# Patient Record
Sex: Female | Born: 1955 | Race: White | Hispanic: No | Marital: Married | Smoking: Former smoker
Health system: Southern US, Community
[De-identification: ages and names within clinical notes are randomized; demographics above are authoritative.]

## PROBLEM LIST (undated history)

## (undated) DIAGNOSIS — F32A Depression, unspecified: Secondary | ICD-10-CM

## (undated) DIAGNOSIS — I10 Essential (primary) hypertension: Secondary | ICD-10-CM

## (undated) DIAGNOSIS — E785 Hyperlipidemia, unspecified: Secondary | ICD-10-CM

## (undated) DIAGNOSIS — F329 Major depressive disorder, single episode, unspecified: Secondary | ICD-10-CM

## (undated) DIAGNOSIS — S12100A Unspecified displaced fracture of second cervical vertebra, initial encounter for closed fracture: Secondary | ICD-10-CM

## (undated) DIAGNOSIS — N809 Endometriosis, unspecified: Secondary | ICD-10-CM

## (undated) DIAGNOSIS — Z923 Personal history of irradiation: Secondary | ICD-10-CM

## (undated) DIAGNOSIS — D510 Vitamin B12 deficiency anemia due to intrinsic factor deficiency: Secondary | ICD-10-CM

## (undated) DIAGNOSIS — C50919 Malignant neoplasm of unspecified site of unspecified female breast: Secondary | ICD-10-CM

## (undated) DIAGNOSIS — F1910 Other psychoactive substance abuse, uncomplicated: Secondary | ICD-10-CM

## (undated) DIAGNOSIS — IMO0002 Reserved for concepts with insufficient information to code with codable children: Secondary | ICD-10-CM

## (undated) HISTORY — PX: ABLATION ON ENDOMETRIOSIS: SHX5787

## (undated) HISTORY — DX: Essential (primary) hypertension: I10

## (undated) HISTORY — DX: Vitamin B12 deficiency anemia due to intrinsic factor deficiency: D51.0

## (undated) HISTORY — DX: Other psychoactive substance abuse, uncomplicated: F19.10

## (undated) HISTORY — DX: Hyperlipidemia, unspecified: E78.5

## (undated) HISTORY — DX: Major depressive disorder, single episode, unspecified: F32.9

## (undated) HISTORY — DX: Malignant neoplasm of unspecified site of unspecified female breast: C50.919

## (undated) HISTORY — DX: Depression, unspecified: F32.A

## (undated) HISTORY — DX: Endometriosis, unspecified: N80.9

## (undated) HISTORY — DX: Unspecified displaced fracture of second cervical vertebra, initial encounter for closed fracture: S12.100A

## (undated) HISTORY — DX: Personal history of irradiation: Z92.3

## (undated) HISTORY — DX: Reserved for concepts with insufficient information to code with codable children: IMO0002

---

## 1991-07-09 HISTORY — PX: TUBAL LIGATION: SHX77

## 1998-07-31 ENCOUNTER — Other Ambulatory Visit: Admission: RE | Admit: 1998-07-31 | Discharge: 1998-07-31 | Payer: Self-pay | Admitting: Obstetrics & Gynecology

## 1999-08-29 ENCOUNTER — Other Ambulatory Visit: Admission: RE | Admit: 1999-08-29 | Discharge: 1999-08-29 | Payer: Self-pay | Admitting: Obstetrics & Gynecology

## 1999-11-07 ENCOUNTER — Encounter: Admission: RE | Admit: 1999-11-07 | Discharge: 1999-11-07 | Payer: Self-pay | Admitting: Surgery

## 1999-11-07 ENCOUNTER — Other Ambulatory Visit: Admission: RE | Admit: 1999-11-07 | Discharge: 1999-11-07 | Payer: Self-pay | Admitting: Radiology

## 1999-11-07 ENCOUNTER — Encounter: Payer: Self-pay | Admitting: Surgery

## 1999-11-15 ENCOUNTER — Encounter: Payer: Self-pay | Admitting: Surgery

## 1999-11-15 ENCOUNTER — Encounter (INDEPENDENT_AMBULATORY_CARE_PROVIDER_SITE_OTHER): Payer: Self-pay | Admitting: *Deleted

## 1999-11-15 ENCOUNTER — Ambulatory Visit (HOSPITAL_COMMUNITY): Admission: RE | Admit: 1999-11-15 | Discharge: 1999-11-15 | Payer: Self-pay | Admitting: Surgery

## 1999-11-15 HISTORY — PX: BREAST LUMPECTOMY WITH AXILLARY LYMPH NODE BIOPSY: SHX5593

## 1999-11-22 ENCOUNTER — Encounter: Admission: RE | Admit: 1999-11-22 | Discharge: 2000-02-20 | Payer: Self-pay | Admitting: Radiation Oncology

## 1999-12-06 ENCOUNTER — Ambulatory Visit (HOSPITAL_COMMUNITY): Admission: RE | Admit: 1999-12-06 | Discharge: 1999-12-06 | Payer: Self-pay | Admitting: Oncology

## 1999-12-06 ENCOUNTER — Encounter: Payer: Self-pay | Admitting: Oncology

## 2000-03-03 ENCOUNTER — Encounter: Admission: RE | Admit: 2000-03-03 | Discharge: 2000-06-01 | Payer: Self-pay | Admitting: Radiation Oncology

## 2000-08-29 ENCOUNTER — Other Ambulatory Visit: Admission: RE | Admit: 2000-08-29 | Discharge: 2000-08-29 | Payer: Self-pay | Admitting: Obstetrics & Gynecology

## 2000-11-17 ENCOUNTER — Ambulatory Visit: Admission: RE | Admit: 2000-11-17 | Discharge: 2001-02-15 | Payer: Self-pay | Admitting: Radiation Oncology

## 2003-03-09 HISTORY — PX: LAMINECTOMY: SHX219

## 2003-03-09 HISTORY — PX: SPINAL FUSION: SHX223

## 2003-03-25 ENCOUNTER — Encounter: Payer: Self-pay | Admitting: Neurosurgery

## 2003-03-29 ENCOUNTER — Encounter (INDEPENDENT_AMBULATORY_CARE_PROVIDER_SITE_OTHER): Payer: Self-pay | Admitting: Specialist

## 2003-03-29 ENCOUNTER — Encounter: Payer: Self-pay | Admitting: Neurosurgery

## 2003-03-29 ENCOUNTER — Inpatient Hospital Stay (HOSPITAL_COMMUNITY): Admission: RE | Admit: 2003-03-29 | Discharge: 2003-04-03 | Payer: Self-pay | Admitting: Neurosurgery

## 2003-08-01 ENCOUNTER — Other Ambulatory Visit: Admission: RE | Admit: 2003-08-01 | Discharge: 2003-08-01 | Payer: Self-pay | Admitting: Obstetrics & Gynecology

## 2004-08-31 ENCOUNTER — Other Ambulatory Visit: Admission: RE | Admit: 2004-08-31 | Discharge: 2004-08-31 | Payer: Self-pay | Admitting: Obstetrics & Gynecology

## 2004-10-25 ENCOUNTER — Ambulatory Visit: Payer: Self-pay | Admitting: Oncology

## 2005-04-19 ENCOUNTER — Ambulatory Visit: Payer: Self-pay | Admitting: Oncology

## 2005-09-27 ENCOUNTER — Other Ambulatory Visit: Admission: RE | Admit: 2005-09-27 | Discharge: 2005-09-27 | Payer: Self-pay | Admitting: Obstetrics & Gynecology

## 2005-10-17 ENCOUNTER — Ambulatory Visit: Payer: Self-pay | Admitting: Oncology

## 2005-10-18 LAB — CBC WITH DIFFERENTIAL/PLATELET
Eosinophils Absolute: 0.1 10*3/uL (ref 0.0–0.5)
HCT: 36.5 % (ref 34.8–46.6)
HGB: 12.5 g/dL (ref 11.6–15.9)
LYMPH%: 17.7 % (ref 14.0–48.0)
MCH: 33.9 pg (ref 26.0–34.0)
MCHC: 34.3 g/dL (ref 32.0–36.0)
MCV: 98.9 fL (ref 81.0–101.0)
NEUT#: 5.7 10*3/uL (ref 1.5–6.5)
NEUT%: 72.5 % (ref 39.6–76.8)
RBC: 3.7 10*6/uL (ref 3.70–5.32)
WBC: 7.8 10*3/uL (ref 3.9–10.0)
lymph#: 1.4 10*3/uL (ref 0.9–3.3)

## 2005-10-18 LAB — COMPREHENSIVE METABOLIC PANEL
AST: 28 U/L (ref 0–37)
Alkaline Phosphatase: 52 U/L (ref 39–117)
CO2: 24 mEq/L (ref 19–32)
Calcium: 10.4 mg/dL (ref 8.4–10.5)
Chloride: 100 mEq/L (ref 96–112)
Creatinine, Ser: 1 mg/dL (ref 0.4–1.2)
Total Protein: 7.7 g/dL (ref 6.0–8.3)

## 2005-10-18 LAB — LACTATE DEHYDROGENASE: LDH: 181 U/L (ref 94–250)

## 2005-10-31 ENCOUNTER — Ambulatory Visit (HOSPITAL_BASED_OUTPATIENT_CLINIC_OR_DEPARTMENT_OTHER): Admission: RE | Admit: 2005-10-31 | Discharge: 2005-10-31 | Payer: Self-pay | Admitting: Family Medicine

## 2005-11-03 ENCOUNTER — Ambulatory Visit: Payer: Self-pay | Admitting: Internal Medicine

## 2006-04-16 ENCOUNTER — Ambulatory Visit: Payer: Self-pay | Admitting: Oncology

## 2006-10-06 ENCOUNTER — Ambulatory Visit: Payer: Self-pay | Admitting: Pulmonary Disease

## 2006-10-28 ENCOUNTER — Ambulatory Visit: Payer: Self-pay | Admitting: Oncology

## 2006-10-31 LAB — CBC WITH DIFFERENTIAL/PLATELET
Basophils Absolute: 0.1 10*3/uL (ref 0.0–0.1)
HCT: 35.1 % (ref 34.8–46.6)
HGB: 12.3 g/dL (ref 11.6–15.9)
MONO#: 0.6 10*3/uL (ref 0.1–0.9)
NEUT#: 3.6 10*3/uL (ref 1.5–6.5)
NEUT%: 60.6 % (ref 39.6–76.8)
WBC: 5.9 10*3/uL (ref 3.9–10.0)
lymph#: 1.5 10*3/uL (ref 0.9–3.3)

## 2006-10-31 LAB — COMPREHENSIVE METABOLIC PANEL
ALT: 17 U/L (ref 0–35)
Albumin: 5.1 g/dL (ref 3.5–5.2)
BUN: 23 mg/dL (ref 6–23)
CO2: 26 mEq/L (ref 19–32)
Calcium: 10 mg/dL (ref 8.4–10.5)
Chloride: 101 mEq/L (ref 96–112)
Creatinine, Ser: 0.91 mg/dL (ref 0.40–1.20)

## 2006-10-31 LAB — LACTATE DEHYDROGENASE: LDH: 193 U/L (ref 94–250)

## 2007-04-29 ENCOUNTER — Ambulatory Visit: Payer: Self-pay | Admitting: Oncology

## 2007-05-01 LAB — CBC WITH DIFFERENTIAL/PLATELET
BASO%: 0.5 % (ref 0.0–2.0)
EOS%: 1.3 % (ref 0.0–7.0)
HCT: 34.7 % — ABNORMAL LOW (ref 34.8–46.6)
MCH: 34.1 pg — ABNORMAL HIGH (ref 26.0–34.0)
MCHC: 35.5 g/dL (ref 32.0–36.0)
MCV: 96.3 fL (ref 81.0–101.0)
MONO%: 9 % (ref 0.0–13.0)
NEUT%: 56.2 % (ref 39.6–76.8)
lymph#: 2.3 10*3/uL (ref 0.9–3.3)

## 2007-05-02 LAB — COMPREHENSIVE METABOLIC PANEL
ALT: 17 U/L (ref 0–35)
AST: 21 U/L (ref 0–37)
Alkaline Phosphatase: 70 U/L (ref 39–117)
BUN: 19 mg/dL (ref 6–23)
Chloride: 97 mEq/L (ref 96–112)
Creatinine, Ser: 0.84 mg/dL (ref 0.40–1.20)
Total Bilirubin: 0.4 mg/dL (ref 0.3–1.2)

## 2007-05-06 ENCOUNTER — Emergency Department (HOSPITAL_COMMUNITY): Admission: EM | Admit: 2007-05-06 | Discharge: 2007-05-07 | Payer: Self-pay | Admitting: Emergency Medicine

## 2007-07-22 ENCOUNTER — Encounter: Admission: RE | Admit: 2007-07-22 | Discharge: 2007-07-22 | Payer: Self-pay | Admitting: Neurosurgery

## 2007-08-24 ENCOUNTER — Encounter: Admission: RE | Admit: 2007-08-24 | Discharge: 2007-08-24 | Payer: Self-pay | Admitting: Neurosurgery

## 2007-10-08 ENCOUNTER — Ambulatory Visit: Payer: Self-pay | Admitting: Oncology

## 2007-10-23 LAB — CBC WITH DIFFERENTIAL/PLATELET
Basophils Absolute: 0 10*3/uL (ref 0.0–0.1)
Eosinophils Absolute: 0.1 10*3/uL (ref 0.0–0.5)
HCT: 38.3 % (ref 34.8–46.6)
HGB: 13.4 g/dL (ref 11.6–15.9)
MCV: 94.4 fL (ref 81.0–101.0)
MONO#: 0.4 10*3/uL (ref 0.1–0.9)
MONO%: 7.6 % (ref 0.0–13.0)
NEUT#: 4 10*3/uL (ref 1.5–6.5)
NEUT%: 67.6 % (ref 39.6–76.8)
RBC: 4.06 10*6/uL (ref 3.70–5.32)
RDW: 13.3 % (ref 11.3–14.5)
WBC: 5.9 10*3/uL (ref 3.9–10.0)
lymph#: 1.3 10*3/uL (ref 0.9–3.3)

## 2007-10-26 LAB — CANCER ANTIGEN 27.29: CA 27.29: 30 U/mL (ref 0–39)

## 2007-10-26 LAB — LACTATE DEHYDROGENASE: LDH: 185 U/L (ref 94–250)

## 2007-10-26 LAB — COMPREHENSIVE METABOLIC PANEL: Albumin: 4.6 g/dL (ref 3.5–5.2)

## 2007-10-26 LAB — VITAMIN D 25 HYDROXY (VIT D DEFICIENCY, FRACTURES): Vit D, 25-Hydroxy: 45 ng/mL (ref 30–89)

## 2007-11-02 ENCOUNTER — Encounter: Admission: RE | Admit: 2007-11-02 | Discharge: 2007-11-02 | Payer: Self-pay | Admitting: Family Medicine

## 2008-10-12 ENCOUNTER — Ambulatory Visit: Payer: Self-pay | Admitting: Oncology

## 2008-10-14 LAB — CBC WITH DIFFERENTIAL/PLATELET
BASO%: 0.6 % (ref 0.0–2.0)
Basophils Absolute: 0 10*3/uL (ref 0.0–0.1)
EOS%: 2.8 % (ref 0.0–7.0)
Eosinophils Absolute: 0.2 10*3/uL (ref 0.0–0.5)
HCT: 39.7 % (ref 34.8–46.6)
HGB: 13.6 g/dL (ref 11.6–15.9)
LYMPH%: 23.1 % (ref 14.0–49.7)
MCH: 33.1 pg (ref 25.1–34.0)
MCHC: 34.3 g/dL (ref 31.5–36.0)
MCV: 96.5 fL (ref 79.5–101.0)
MONO#: 0.4 10*3/uL (ref 0.1–0.9)
MONO%: 5.7 % (ref 0.0–14.0)
NEUT#: 4.2 10*3/uL (ref 1.5–6.5)
NEUT%: 67.8 % (ref 38.4–76.8)
Platelets: 385 10*3/uL (ref 145–400)
RBC: 4.11 10*6/uL (ref 3.70–5.45)
RDW: 13.8 % (ref 11.2–14.5)
WBC: 6.2 10*3/uL (ref 3.9–10.3)
lymph#: 1.4 10*3/uL (ref 0.9–3.3)

## 2008-10-17 LAB — COMPREHENSIVE METABOLIC PANEL
ALT: 26 U/L (ref 0–35)
BUN: 12 mg/dL (ref 6–23)
CO2: 26 mEq/L (ref 19–32)
Chloride: 105 mEq/L (ref 96–112)
Glucose, Bld: 111 mg/dL — ABNORMAL HIGH (ref 70–99)
Potassium: 4.4 mEq/L (ref 3.5–5.3)
Sodium: 141 mEq/L (ref 135–145)

## 2008-10-17 LAB — LACTATE DEHYDROGENASE: LDH: 223 U/L (ref 94–250)

## 2008-10-28 ENCOUNTER — Encounter: Admission: RE | Admit: 2008-10-28 | Discharge: 2008-10-28 | Payer: Self-pay | Admitting: Oncology

## 2009-09-17 IMAGING — CT CT CERVICAL SPINE W/O CM
5 series · 16 of 33 positions shown, 18 images · IV contrast (agent unspecified)
Comparison: 07/12/07.

CLINICAL DATA: 51-year-old C-2 fracture. 
 CERVICAL SPINE   CT WITHOUT CONTRAST:
TECHNIQUE: Multidetector CT imaging of the cervical spine was performed.  Multiplanar   CT image reconstructions were also generated.

[Series 3: c spine · axial · 0.23mm/px · z∈[+51,+117]mm · 2 of 160 slices shown]
[im 54/160  bone]
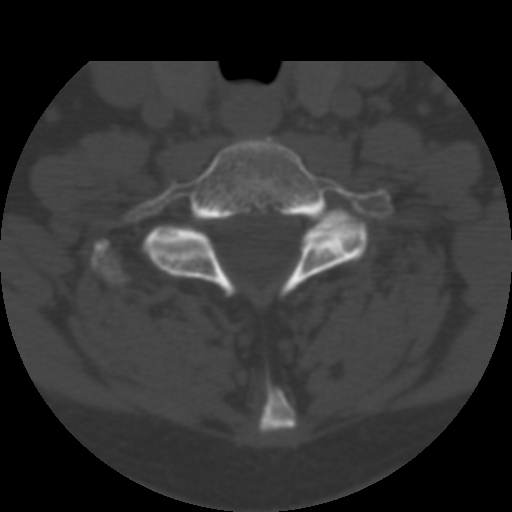
[im 107/160  bone]
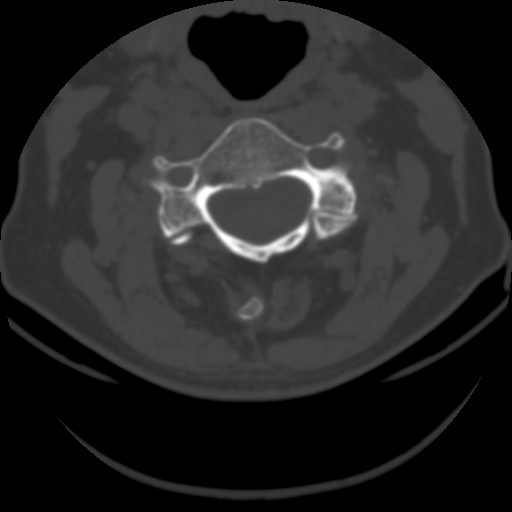

[Series 4: bone windows · axial · 0.23mm/px · z∈[+47,+115]mm · 2 of 164 slices shown]
[im 55/164  bone]
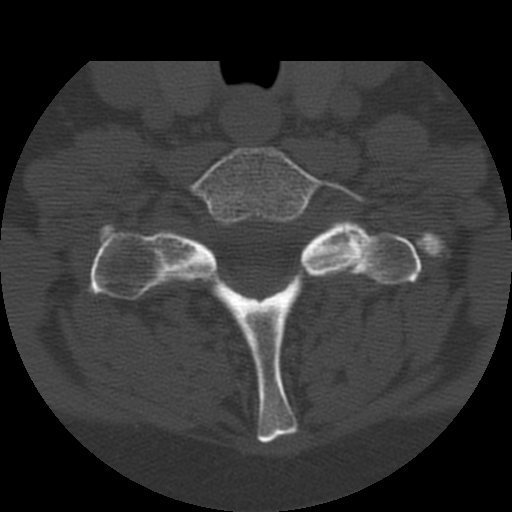
[im 109/164  bone]
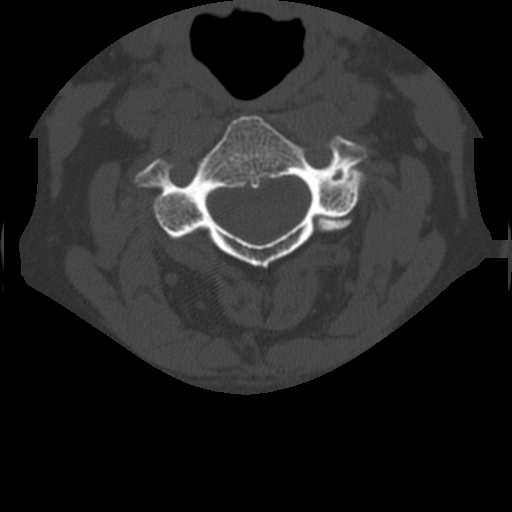

[Series 200: sagittal · sagittal · 0.41mm/px · 5 of 40 slices shown, 6 images]
[im 14/40  bone]
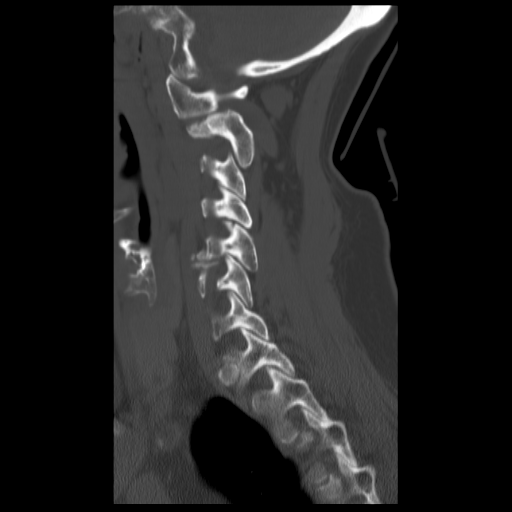
[im 17/40  bone]
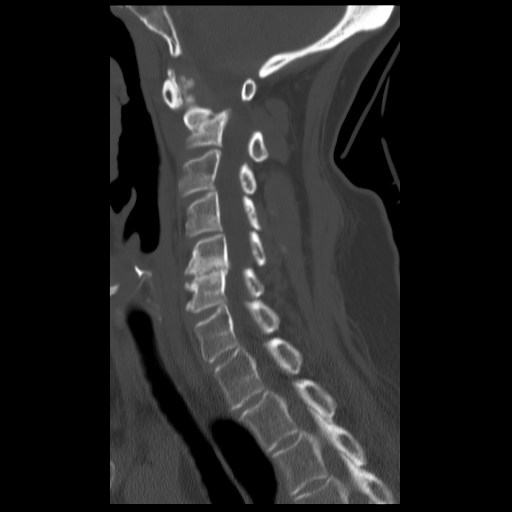
[im 20/40  soft-tissue]
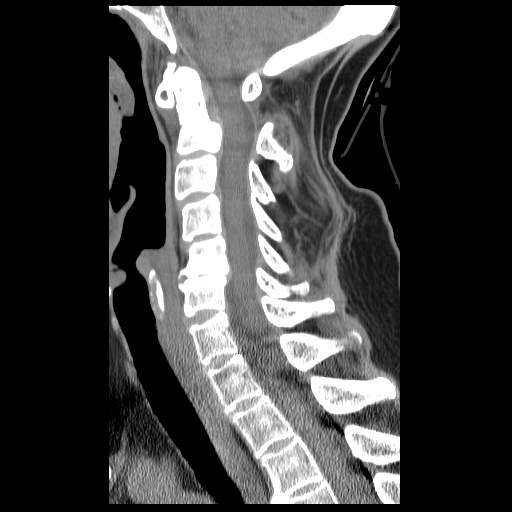
[im 20/40  bone]
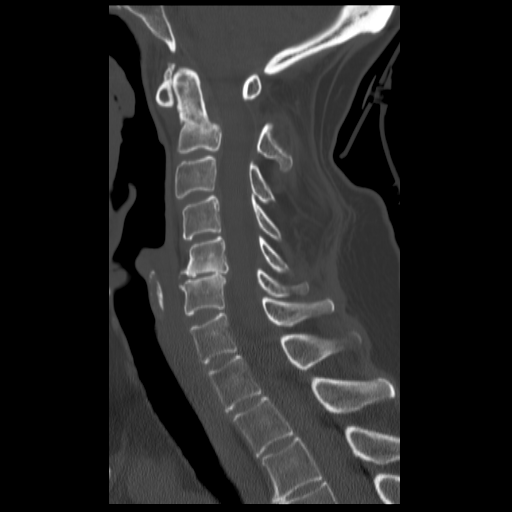
[im 23/40  bone]
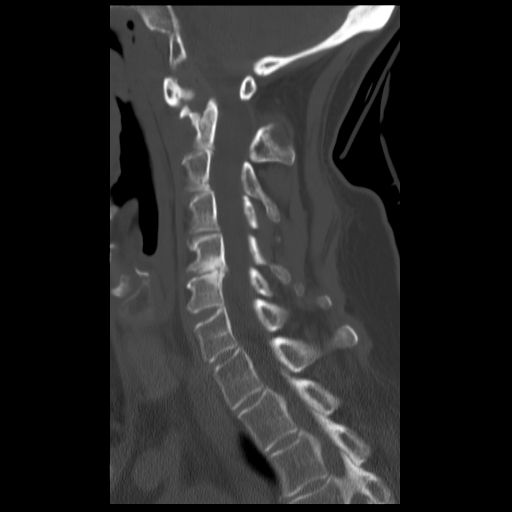
[im 27/40  bone]
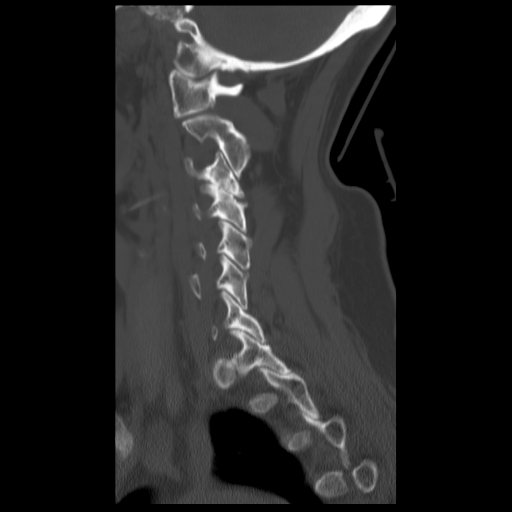

[Series 201: coronal · coronal · 0.41mm/px · 3 of 40 slices shown]
[im 8/40  bone]
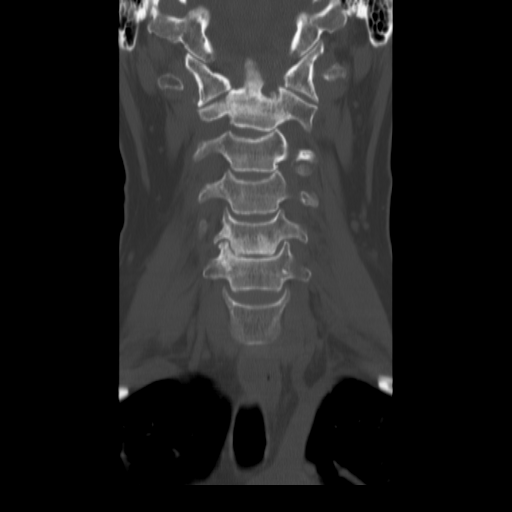
[im 16/40  bone]
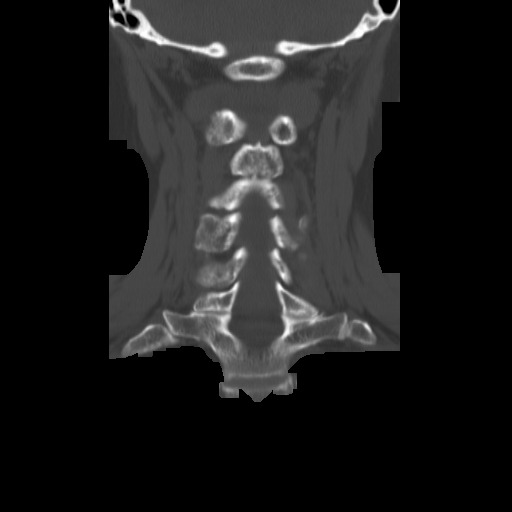
[im 24/40  bone]
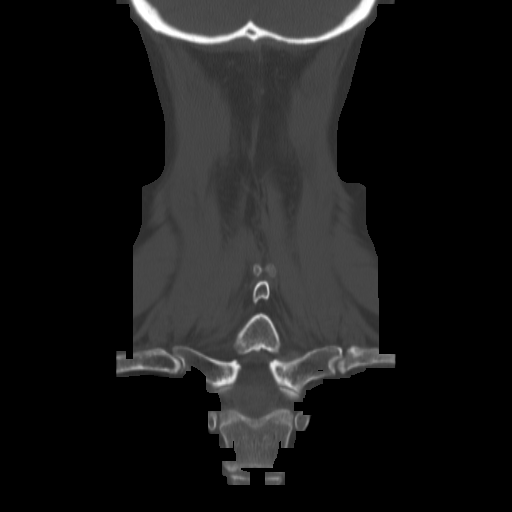

[Series 202: axial ref · axial · 0.20mm/px · z∈[+6,+118]mm · 4 of 274 slices shown, 5 images]
[im 55/274  soft-tissue]
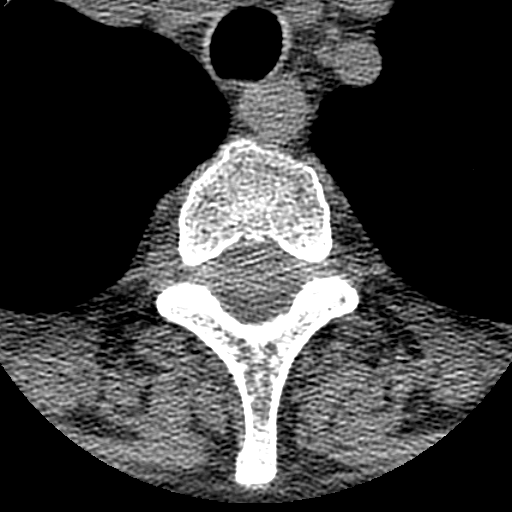
[im 55/274  bone]
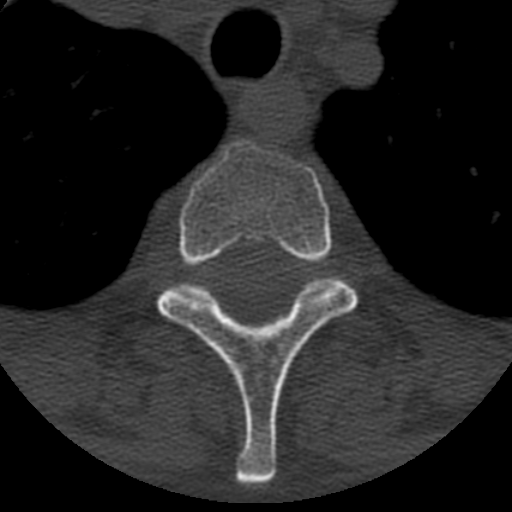
[im 110/274  bone]
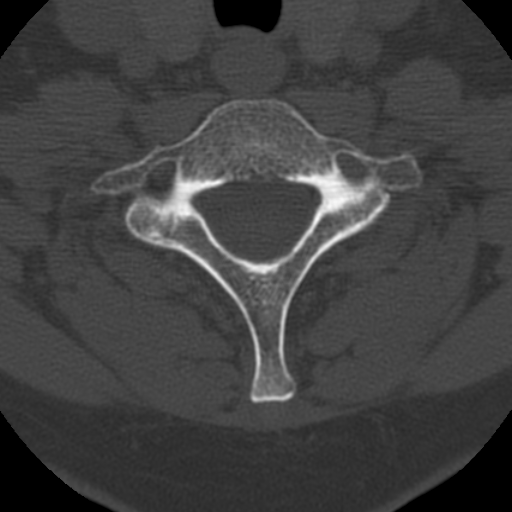
[im 164/274  bone]
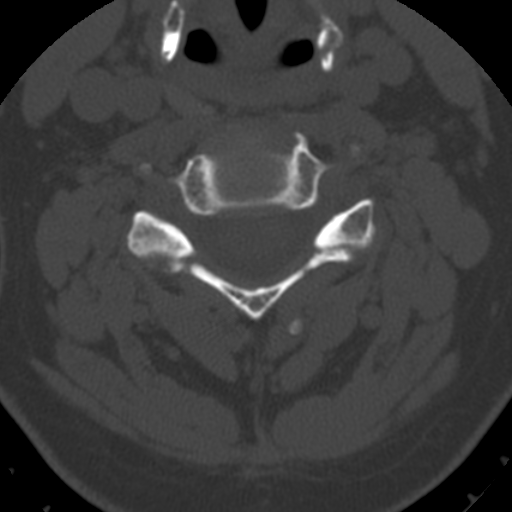
[im 219/274  bone]
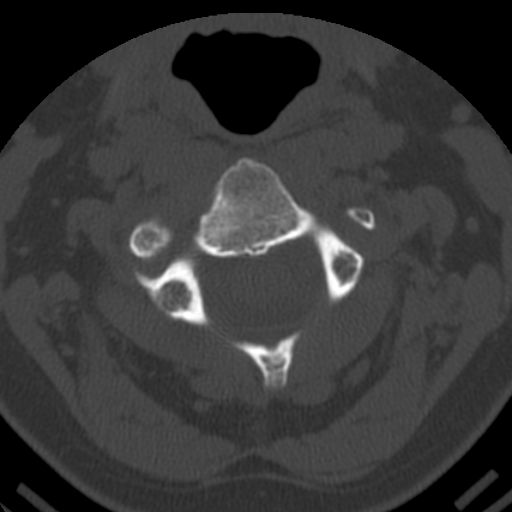

[16 of 33 positions shown; findings below may reference images not displayed]

FINDINGS: The C-2 fracture appears relatively stable.  I think there is slight continued bony ingrowth at the fracture sites.  The angulation of the C-2 fracture is stable.  
 Stable degenerative disc disease at C5-6 with degenerative subluxation.  
 The spinal canal is not compromised.  Facets are normally aligned.  No abnormal prevertebral soft tissue swelling.
IMPRESSION: 1.  Continued slight bony ingrowth at the fracture site of C-2.  No increase in angulation when compared with the prior study.  Fracture lines are slightly less obvious. 
 2.  Stable degenerative disc disease at C5-6. 
 3.  Stable facet disease at C3-4 on the left.

## 2009-10-18 ENCOUNTER — Ambulatory Visit: Payer: Self-pay | Admitting: Oncology

## 2009-10-26 LAB — CBC WITH DIFFERENTIAL/PLATELET
Basophils Absolute: 0.1 10*3/uL (ref 0.0–0.1)
Eosinophils Absolute: 0.2 10*3/uL (ref 0.0–0.5)
LYMPH%: 34.1 % (ref 14.0–49.7)
MCH: 32.2 pg (ref 25.1–34.0)
MONO#: 0.7 10*3/uL (ref 0.1–0.9)
NEUT%: 52.7 % (ref 38.4–76.8)
lymph#: 2.4 10*3/uL (ref 0.9–3.3)

## 2009-10-26 LAB — COMPREHENSIVE METABOLIC PANEL
ALT: 21 U/L (ref 0–35)
Alkaline Phosphatase: 56 U/L (ref 39–117)
BUN: 16 mg/dL (ref 6–23)
Calcium: 9.9 mg/dL (ref 8.4–10.5)
Creatinine, Ser: 0.92 mg/dL (ref 0.40–1.20)
Glucose, Bld: 95 mg/dL (ref 70–99)
Total Bilirubin: 0.6 mg/dL (ref 0.3–1.2)
Total Protein: 7 g/dL (ref 6.0–8.3)

## 2009-10-26 LAB — LACTATE DEHYDROGENASE: LDH: 218 U/L (ref 94–250)

## 2010-11-07 ENCOUNTER — Other Ambulatory Visit: Payer: Self-pay | Admitting: Oncology

## 2010-11-07 ENCOUNTER — Encounter: Payer: Medicare HMO | Admitting: Oncology

## 2010-11-07 DIAGNOSIS — Z170 Estrogen receptor positive status [ER+]: Secondary | ICD-10-CM

## 2010-11-07 DIAGNOSIS — C50919 Malignant neoplasm of unspecified site of unspecified female breast: Secondary | ICD-10-CM

## 2010-11-07 LAB — CBC WITH DIFFERENTIAL/PLATELET
HGB: 12.4 g/dL (ref 11.6–15.9)
MCV: 96.2 fL (ref 79.5–101.0)
MONO%: 9 % (ref 0.0–14.0)
NEUT#: 3.1 10*3/uL (ref 1.5–6.5)
RDW: 13.1 % (ref 11.2–14.5)
WBC: 6.1 10*3/uL (ref 3.9–10.3)
lymph#: 2.2 10*3/uL (ref 0.9–3.3)

## 2010-11-08 LAB — COMPREHENSIVE METABOLIC PANEL
ALT: 18 U/L (ref 0–35)
AST: 23 U/L (ref 0–37)
Alkaline Phosphatase: 51 U/L (ref 39–117)
CO2: 26 mEq/L (ref 19–32)
Chloride: 99 mEq/L (ref 96–112)
Creatinine, Ser: 0.92 mg/dL (ref 0.40–1.20)
Potassium: 4.3 mEq/L (ref 3.5–5.3)
Sodium: 134 mEq/L — ABNORMAL LOW (ref 135–145)

## 2010-11-08 LAB — VITAMIN D 25 HYDROXY (VIT D DEFICIENCY, FRACTURES): Vit D, 25-Hydroxy: 61 ng/mL (ref 30–89)

## 2010-11-08 LAB — CANCER ANTIGEN 27.29: CA 27.29: 24 U/mL (ref 0–39)

## 2010-11-08 LAB — LACTATE DEHYDROGENASE: LDH: 212 U/L (ref 94–250)

## 2010-11-20 ENCOUNTER — Encounter (HOSPITAL_BASED_OUTPATIENT_CLINIC_OR_DEPARTMENT_OTHER): Payer: Medicare HMO | Admitting: Oncology

## 2010-11-20 DIAGNOSIS — Z170 Estrogen receptor positive status [ER+]: Secondary | ICD-10-CM

## 2010-11-20 DIAGNOSIS — C50919 Malignant neoplasm of unspecified site of unspecified female breast: Secondary | ICD-10-CM

## 2010-11-20 NOTE — Consult Note (Signed)
NAMERAYMONDE, HAMBLIN NO.:  [id number]   MEDICAL RECORD NO.:  [id number]          PATIENT TYPE:  EMS   LOCATION:  MAJO                         FACILITY:  [facility]   PHYSICIAN:  Coletta Memos, M.D.     DATE OF BIRTH:  02-19-1956   DATE OF CONSULTATION:  05/07/2007  DATE OF DISCHARGE:                                 CONSULTATION   CHIEF COMPLAINT:  C2 fracture, neck pain.   INDICATIONS:  Ms. Mckowen is a 55 year old woman who took a fall down  the stairs tonight.  She was not quite sure what happened as she  probably lost consciousness very briefly according to her husband, who  was home at the time and heard her fall.  She was able to move all  extremities at that time.  She was brought to [facility] as a  result of a laceration she had to the back of her head and bruising to  her forehead.  While here, she did complain of neck pain.  She therefore  was sent for a head and cervical spine CT.  That revealed a comminuted  fracture of C2, a hybrid of type II and III.  I was called for a  consultation.   Mrs. Borgmeyer's past medical history is significant for breast cancer,  hypertension and pernicious anemia.   She currently smokes.  She does not have a history of IV drug use.  She  does drink and had been drinking some tonight.   CURRENT MEDICATIONS:  Femara, hydrochlorothiazide, Lotrel, vitamin B12,  vitamin C.   She says she is allergic to PREDNISONE.   VITAL SIGNS:  96.5, blood pressure 128/83, pulse 76, respiratory rate  22.  She did have ab alcohol level of 169 tonight.  She is here with her  husband, who will be going home with her.   The patient did receive a diphtheria and tetanus toxoid shot tonight.   FAMILY HISTORY:  Positive for cancer.  Mother and father both deceased.  She has one child, age 19, in good health.  She is currently unemployed,  having just quit her job.   EXAMINATION:  She is alert and oriented x4 and answering all  questions  appropriately.  Memory, language, attention span and fund of knowledge  are normal.  Speech is clear and fluent.  Pupils equal, round, react to  light.  Funduscopic examination is normal.  She has full extraocular  movements, full visual fields.  She has symmetric facies, symmetric  facial sensation and movements.  Hearing intact to voice bilaterally.  Tongue protrusion in the midline.  Uvula elevates in the midline.  Shoulder shrug is normal.  No cervical masses or bruits.  Lung fields clear.  HEART:  Regular rhythm and rate.  Pulses good at the wrists bilaterally.  ABDOMEN:  Soft, nontender.  Bowel sounds are present.  She has no blood in external auditory canals.  Light seen bilaterally.  Head:  She has a laceration which has been repaired with staples  primarily in the posterior aspect.  She has a small ecchymotic region  over the left forehead, which is also slightly edematous.  She has a  bruised tongue and appears to have a small superficial laceration on the  inferior surface of the tongue.  On motor examination she has 5/5  strength in both the upper and lower extremities.  She has intact  proprioception, intact light touch.  Muscle tone and bulk, coordination  are normal.   The CT was reviewed with the radiologist.  She has a comminuted C2  fracture.  Alignment is maintained.  The spinal canal is patent.  She  has some pretty significant degenerative disease in the neck, I believe  that C5-6.   Ms. Hayton, I believe, can be treated successfully in a cervical  collar.  She will receive both a [place] J and a [place] collar.  The  [place] collar's purpose is to be used while she is showering.  The  [place] J at all other times.  She knows that she needs to wear the collar  24 hours a day.  I will see her back in the office in approximately 2  weeks to repeat the x-rays.  She is neurologically intact.  She has no  deficits.            ______________________________  Coletta Memos, M.D.     KC/MEDQ  D:  05/07/2007  T:  05/07/2007  Job:  [id number]

## 2010-11-23 NOTE — Assessment & Plan Note (Signed)
[name]                             PULMONARY OFFICE NOTE   Sherry [name], Sherry [name]                      MRN:          [id number]  DATE:10/06/2006                            DOB:          [birth year]    SLEEP CONSULTATION:  I saw Sherry [name] today for evaluation of her sleep  apnea.   She had undergone an overnight polysomnogram on November 04, 2005, and it  showed evidence for severe obstructive sleep apnea with an  apnea/hypopnea index of 66.7 and an oxygen saturation nadir of 86%.  She  followed a split-night study protocol and during the diagnostic portion  of the study was titrated to a CPAP pressure setting of 13 cmH2O with a  reduction in her apnea/hypopnea index to 0.   She says that she was started initially on CPAP after this study with a  full face mask and heated humidification.  She said that when she was  using the full face mask she had terrible gas pains as well a dryness in  her mouth.  She then brought out her old nasal pillows mask, which she  said caused her to develop a significant amount of rhinorrhea and she  felt like she was getting too much pressure in her sinuses.  As a  result, she had stopped wearing her CPAP machine.  She says, however,  that when she was using it she noticed a difference as far as the  quality of her sleep at night as well as her energy level during the  day.  She was initially referred to have her sleep study done because  her husband had been diagnosed with sleep apnea and after he was  diagnosed, he was more aware of the symptoms and noticed that she was  having similar symptoms.  Specifically, she was having symptoms of  snoring as well as witnessed apnea.  She would wake up with a choking  sensation and tend to have a dry mouth at night.  She also grinds her  teeth and has had a significant amount of periodontal work, and she  would also get cramps in her legs.  She will often times get sleepy  during the day, and she does occasionally talk in her sleep.  There is  no history of restless legs syndrome.  She denies any symptoms of sleep  hallucinations, sleep paralysis or cataplexy.  She drinks 2-3 cups of  coffee in the morning.  She does not use anything to help her fall  asleep at night.  Her Epworth score is 8/24.   PAST MEDICAL HISTORY:  1. Hypertension.  2. Breast cancer, status post modified mastectomy and radiation and      chemotherapy in 2001.  She is currently being seen by Sherry [name],      M.D.  3. She has allergies.  4. Sleep apnea.  5. She has had back surgery in 2005.  6. She had a tubal ligation.   CURRENT MEDICATIONS:  1. Femara 2.5 mg daily.  2. Neurontin 300 mg b.i.d.  3. Diclofenac 75 mg daily  4. Effexor 75 mg daily.  5. Bisoprolol/hydrochlorothiazide one daily.  6. Zyrtec 10 mg daily.  7. Caltrate 600 mg b.i.d.  8. B12 100 mg daily.   She had an allergy to PREDNISONE, which she said caused her to develop  difficulties with her breathing.   SOCIAL HISTORY:  She is married.  She works as a Software engineer at  [employer].  She has one son.  She smokes one pack of cigarettes  per day for the last 35 years.  She has occasional alcohol use.   FAMILY HISTORY:  Significant for father who had hypertension and kidney  cancer.  Her mother had amyloidosis, colon cancer and hypertension.   REVIEW OF SYSTEMS:  Essentially negative except for as stated above.   PHYSICAL EXAMINATION:  VITAL SIGNS:  She is 5 feet 5 inches tall, 144  pounds.  Temperature 98.5, blood pressure is 158/98, heart rate is 70,  oxygen saturation is 98% on room air.  HEENT:  Pupils are reactive.  Extraocular muscles intact.  She has mild  alar collapse with ventilation.  There is no sinus tenderness.  No nasal  discharge.  She has a Mallampati 4 airway.  NECK:  There was no lymphadenopathy.  HEART:  S1, S2.  CHEST:  Clear to auscultation.  ABDOMEN:  Soft, nontender,  positive bowel sounds.  EXTREMITIES:  There was no edema, sinus or clubbing.  NEUROLOGIC:  No focal deficits were appreciated.   IMPRESSION:  1. Severe obstructive sleep apnea as evidenced by an apnea/hypopnea      index of 67 and an oxygen saturation nadir of 86%.  I reviewed the      results of her sleep study with her.  I discussed the adverse      consequences of untreated sleep apnea including increased risk of      diabetes, hypertension, coronary artery disease, cerebrovascular      disease, disease and irregular heart beat.  I discussed with her      the importance of diet, exercise and weight reduction as well as      the avoidance of alcohol and sedatives.  Driving precautions were      discussed with her as well.  I will have her undergo an auto-CPAP      titration study for the next 2 weeks to determine if she needs to      have any adjustments in her pressure setting.  Additionally, I will      have her mask changed to a nasal mask with a chin strap to see if      this improves her tolerance, as it does appear that her main      difficulty with using CPAP is her tolerance with the mask.      Otherwise, she does seem to have gained a symptomatic benefit when      she was using the machine.  If she is still having difficulty after      this, then she may need to have referral back to the sleep lab for      a full-night CPAP titration study.  2. Tobacco abuse.  I have discussed with her the various techniques      with regard to smoking cessation.  She had tried Chantix previously      and is interested in trying this again.  I discussed with her that      she should follow up with her primary care physician to be  reinitiated on this.   I will follow up with her in approximately 2 months.     Coralyn Helling, MD  Electronically Signed    VS/MedQ  DD: 10/06/2006  DT: 10/06/2006  Job #: [id number]   cc:   [name], M.D.

## 2010-11-23 NOTE — Procedures (Signed)
NAMEBECKY, Sherry [name] NO.:  [id number]   MEDICAL RECORD NO.:  [id number]          PATIENT TYPE:  OUT   LOCATION:  SLEEP CENTER                 FACILITY:  [facility]   PHYSICIAN:  Clinton D. Maple Hudson, M.D. DATE OF BIRTH:  [birth year]   DATE OF STUDY:                              NOCTURNAL POLYSOMNOGRAM   REFERRING PHYSICIAN:  Dr. Marjory Lies   INDICATION FOR STUDY:  Hypersomnia with sleep apnea.   EPWORTH SLEEPINESS SCORE:  11/24, BMI 24.5, weight 144 pounds.   MEDICATIONS:  Prednisone, Lotrel, bisoprolol, Effexor, diclofenac,  Neurontin, Femara.   SLEEP ARCHITECTURE:  Total sleep time 335 minutes with sleep efficiency 93%.  Stage I was 4%, stage II 76%, stages III and IV absent.  REM 21% total sleep  time.  Sleep latency 4.2 minutes.  REM latency 101 minutes.  Awake after  sleep onset 21 minutes.  Arousal index 35.1.  No bedtime medication  reported.   RESPIRATORY DATA:  Split study protocol.  Apnea/hypopnea index (AHI, RDI)  66.7 obstructive events per hour before CPAP indicating severe obstructive  sleep apnea/hypopnea syndrome.  This included four obstructive apneas and  160 hypopneas before CPAP.  Most sleep and most events were reported while  supine.  REM AHI 29.1 per hour.  CPAP was titrated to 13 CWP, AHI 0 per  hour.  The patient was mouth breathing with mouth open during sleep and was  fitted with a medium ResMed full-face mask and heated humidifier.   OXYGEN DATA:  Moderate to loud snoring with oxygen desaturation to a nadir  of 86%.  Mean oxygen saturation after CPAP control was 93-96% on room air.   CARDIAC DATA:  Sinus rhythm with PACs.   MOVEMENT-PARASOMNIA:  Occasional limb jerk, insignificant.   IMPRESSIONS-RECOMMENDATIONS:  1.  Severe obstructive sleep apnea/hypopnea syndrome, AHI 66.7 per hour.      She slept mostly on her back with mouth open.  Snoring was moderate to      loud with oxygen desaturation to 86%.  2.  Successful CPAP titration  to 13 CWP, AHI 0 per hour.  A medium ResMed      full-face mask was used with      heated humidifier.  3.  Sleeping with mouth open suggests possibility of nasal airway      obstruction.      Clinton D. Maple Hudson, M.D.  Diplomate, Biomedical engineer of Sleep Medicine  Electronically Signed     CDY/MEDQ  D:  11/03/2005 13:06:48  T:  11/04/2005 10:50:10  Job:  [id number]

## 2010-11-23 NOTE — Discharge Summary (Signed)
   [name], [name]                         ACCOUNT NO.:  [id number]   MEDICAL RECORD NO.:  [id number]                   PATIENT TYPE:  INP   LOCATION:  3022                                 FACILITY:  [facility]   PHYSICIAN:  Stefani Dama, M.D.               DATE OF BIRTH:  August 27, 1955   DATE OF ADMISSION:  03/29/2003  DATE OF DISCHARGE:  04/03/2003                                 DISCHARGE SUMMARY   ADMISSION DIAGNOSES:  1. 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  2. Lumbar radiculopathy.   DISCHARGE AND FINAL DIAGNOSES:  1. 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  2. Lumbar radiculopathy.   ADDITIONAL DISCHARGE DIAGNOSIS:  Paralytic ileus.   CONDITION ON DISCHARGE:  Improving.   HOSPITAL COURSE:  Ms. Sherry [name] is a 55 year old individual who has had  significant back and left lower extremity  pain secondary to degenerative  spondylolisthesis.  She, having failed extensive efforts at conservative  management, was treated surgically with a lumbar laminectomy decompression  and fixation of L4-L5.  Postoperatively she was ambulated, on March 30, 2003 and March 31, 2003.  However, she had significant difficulty with  back pain.  Pain medications were used to adequately treat this.  However,  the patient developed a paralytic ileus.  She had nausea and vomited on the  evening of the 25th.  After a number of stool softeners and laxatives the  patient's bowels finally moved and she immediately felt significantly better  and was discharged home with a prescription for Percocet, Valium and  Flexeril.  She will be seen in the office in three weeks time for further  followup.                                                 Stefani Dama, M.D.    [name]  D:  04/03/2003  T:  04/04/2003  Job:  [id number]

## 2010-11-23 NOTE — Op Note (Signed)
Sherry [name], Sherry [name]                         ACCOUNT NO.:  [id number]   MEDICAL RECORD NO.:  [id number]                   PATIENT TYPE:  INP   LOCATION:  2874                                 FACILITY:  [facility]   PHYSICIAN:  Clydene Fake, M.D.               DATE OF BIRTH:  Nov 25, 1955   DATE OF PROCEDURE:  03/29/2003  DATE OF DISCHARGE:                                 OPERATIVE REPORT   PREOPERATIVE DIAGNOSIS:  Epidural mass with unstable spondylolisthesis at L4-  5 and stenosis.   POSTOPERATIVE DIAGNOSIS:  Epidural mass with unstable spondylolisthesis at  L4-5 and stenosis.   OPERATION PERFORMED:  L3 through 5 laminectomy for removal of epidural mass,  posterior lumbar interbody fusion L4-5 with Brantigan interbody cages,  nonsegmented pedicle screw fixation with Monarch screws at L4-5,  posterolateral fusion, L4-5, autograft same incision, allograft and  Symphony.   SURGEON:  Clydene Fake, M.D.   ASSISTANT:  Cristi Loron, M.D.   ANESTHESIA:  General endotracheal.   ESTIMATED BLOOD LOSS:  .   BLOOD REPLACED:  One unit Cell Saver given.   PATHOLOGY:  Mass sent for frozen section and permanent sections, frozen came  back non-malignant, some necrosis, possible disk material.   INDICATIONS FOR PROCEDURE:  The patient is a 55 year old woman with history  of breast cancer, who has had back and left leg pain last three to four  months, has been severe.  Pain radiating down to the top of her foot, worse  with any activity and starting to hamper her lifestyle.  Does get numbness  there too but no bowel or bladder complaints.  MRI and x-ray done showing  unstable spondylolisthesis at L4-5 with a large epidural mass on the left  side that appears to be synovial cyst, but cannot rule out malignancy and  patient brought in for decompression and removal of mass  and fusion.   DESCRIPTION OF PROCEDURE:  The patient was brought to the operating room.  General  anesthesia induced.  The patient was placed in a prone position on  the Wilson frame with all pressure points padded.  The patient was prepped  and draped in a sterile fashion.  The site of incision was injected with  10mL of 1% lidocaine with epinephrine.  Incision was then made at the  spinous process of L3, 4 and 5.  Incision taken down to the fascia.  Hemostasis obtained with Bovie cautery.  Fascia was incised.  __________  spinous process and subperiosteal dissection __________  L3, 4, 5 spinous  process and lamina out to the facets bilaterally.  Fluoroscopy was used to  confirm positioning.  We dissected out to the transverse process of L4 and 5  bilaterally.  Then a self-retaining retractor was placed.  Again we checked  our positioning with the fluoroscopy and L4 laminectomy was performed  removing the top of L5 also and then  extending over the epidural mass on the  left side removing part of the L3 lamina. Also facetectomies were done at 4-  5 and were able then with the C-arm to decompress the thecal sac and nerve  roots.  We then explored this mass.  It was stuck to the dura.  We were able  to peel off some of it. It was kind of like a cyst with the interior, kind  of this white substance, part of the capsule and the interior was sent off  for pathology and frozen section came back possible necrotic disk but no  sign of malignancy.  So we continued to remove this.  We were able to peel  it off the dura, especially anteriorly and off the L4 nerve root and off the  L5 nerve root which was attached.  Some of the capsule was left more  posteriorly on top of the dura.  We could not find a good plane to remove  it.  We continued to decompress the 4 and 5 roots bilaterally.  We explored  the disk space at 3-4 and no annular tear was seen there.  There was  spondylolisthesis at 4-5 very unstable.  Disk space was incised at 4-5 with  a 15 blade and diskectomy performed with pituitary  rongeurs and curets.  We  spread the interspace with interspace spreaders, scraped the interspace and  then once we had distraction up to 11 mm used a __________  broach to remove  end plate at both sides.  Once this interspace was ready for fusion, all the  bone that was removed during laminectomy was chopped up in small pieces, put  through the symphony system, made a log with the plasma, and this bone was  then packed into two Brantigan interbody cages, 11 high x 9 wide.  We placed  the same autograft bone material into the interspace and then tapped a cage  into place on one side, removed the distractor and tapped another cage in on  the other side after packing more bone in the interspace.  When we were  finished we had good reduction of the spondylolisthesis.  Good position of  the cages.  Good decompression of all nerve roots.  We decorticated the  spinous processes and lateral facets at 4 and 5.  We found the pedicle entry  point on the right side at L4 and then 5.  High speed drill to decorticate  and placed a pedicle probe down the pedicle.  We checked it with a ball  probe. We tapped the pedicle, tracked it with a ball probe again and placed  a pedicle screw.  A 45 mm screw was placed at L4, 40 mm screw placed at L5.  We then went to the left side, L4 at the pedicle entry point, placed a  probe. The probe went out bilaterally.  We repositioned in the pedicle and  again placed the pedicle probe.  Thought that we had a good purchase there.  Checked with a ball probe, tapped, checked with a ball probe again, all went  well and started placing a screw in the pedicle on the left side at L4 but  it broke out laterally into the other hole that we had made.  We attempted  one more attempt to salvage the pedicle but could not get a screw down to  the pedicle into the vertebral body and we aborted pedicle screw fixation on the left side.  Placed  a rod over the screw heads on the right side.   We  compressed over the interspace and tightened the nuts down over the rods.  When we were finished, we had good decompression of the central canal for  removal of the mass. Nerve roots were all decompressed.  We had restoration  of disk height and reduction of the spondylolisthesis at 4-5.  It was fixed  with unilateral pedicle screw fixation on the right side.  Posterolateral  fusion was then done with the rest of the autograft and allograft that was  put through the Symphony system at L4-5.  Removed retractors and closed the  paraspinous muscle with 0 Vicryl interrupted suture.  The fascia was closed  with the same.  The subcutaneous tissue was  closed with 2-0 and 3-0  interrupted Vicryl suture and the skin closed with Benzoin and Steri-Strips.  Dry sterile dressing was placed.  Patient was placed back into supine  position, awakened from anesthesia and transferred to recovery room in  stable condition.                                                Clydene Fake, M.D.    JRH/MEDQ  D:  03/29/2003  T:  03/29/2003  Job:  [id number]

## 2010-11-23 NOTE — Op Note (Signed)
[place]  Patient:    Sherry [name], Sherry [name]                      MRN: [id number] Proc. Date: 11/15/99 Adm. Date:  [id number] Disc. Date: [id number] Attending:  Charlton Haws                           Operative Report  PREOPERATIVE DIAGNOSIS:  Carcinoma of right breast.  POSTOPERATIVE DIAGNOSIS:  Carcinoma of right breast.  OPERATION:  Right breast lumpectomy (partial mastectomy), blue dye injection, sentinel node dissection.  SURGEON:  Currie Paris, M.D.  ASSISTANTLorin Picket Long, PA-Student.  ANESTHESIA:  General endotracheal.  CLINICAL HISTORY:  This patient is a 55 year old lady, who has presented with a breast mass seen on mammography.  A large core biopsy confirmed carcinoma. After discussion of the alternatives with the patient, she elected to proceed to removal of the mass with sentinel node dissection.  DESCRIPTION OF PROCEDURE:  Patient brought to the operating room, having been injected in radiology, plus having a guidewire placed adjacent to the mass. In the operating room, she was given IV sedation and the breast was prepped and draped.  I injected about 5 cc of Lymphazurin blue dye around the lesion and subareolarly.  Waited approximately five minutes and using neoprobe, identified an area in the axilla that appeared to be hot.  I made a short transverse incision in the axilla.  Subcutaneous tissues were divided and the axilla entered and initially blue lymphatic was traced into the axilla and a second blue lymphatic a little deeper found.  Using the neoprobe, I found that there was a hot area in the general direction of these two lymphatics were heading and with a little further dissection, found a blue lymph node, which appeared to be hot.  This was grasped with a Babcock and removed.  There did not appear to be any other significantly hot nodes.  Most of the tissues were measuring 8 to 20 once the hot node had been  removed with the hot node ranging up to around 600-700.  However, I did see another node that had some blue dye going into it, although, it had not yet turned blue.  This was also excised. These were all sent to pathology.  A pack was placed waiting for the pathology report.  The guidewire entered in the mid lateral position and I decided to make a radial incision.  This was done and the subcutaneous tissues divided and then using some retractors, I took a wide excisional biopsy around the guidewire tract.  I went all the way down to the chest wall and located the tumor was actually at the very top or superior margin of what I was taking out, at least by palpation.  Once the main specimen was out with wide margins inferiorly, medially and laterally, I marked this and oriented it for the pathologist.  I then returned my attention back to the superior margin, took additional margin right at the area where I had been close with the original excision, so I had a new approximately 1 cm deep margin at this area.  The wound was irrigated and checked for hemostasis.  Everything appeared to be dry.  I infiltrated with 0.25% plain Marcaine, put four clips to mark the excision, plus one right at the site where the tumor was, but most of these were right on  muscle.  The wound was packed for a few moments and attention turned back to the axillary incision.  This was also infiltrated with 0.25% Marcaine.  A total of 20 cc was used.  This incision was closed with 3-0 Vicryl, followed by 4-0 Monocryl subcuticular.  I went back and looked at the lumpectomy site and closed it with some 3-0 Vicryl in the subcu and staples on the skin.  The patient tolerated the procedure well.  There are no operative complications.  All counts were correct.  Estimated blood loss was 120 cc. Pathology reported that the three sentinel lymph node were identified and were negative for tumor.  Radiology reported that the  specimen had been retrieved and was seen on specimen mammography.  The procedure was ended at this point, sterile dressings were applied.  The patient was awakened to be returned to the recovery room. DD:  11/15/99 TD:  11/17/99 Job: [id number]

## 2011-03-22 ENCOUNTER — Other Ambulatory Visit: Payer: Self-pay | Admitting: Dermatology

## 2011-04-17 LAB — ETHANOL: Alcohol, Ethyl (B): 169 — ABNORMAL HIGH

## 2011-09-02 ENCOUNTER — Other Ambulatory Visit: Payer: Self-pay

## 2011-11-20 ENCOUNTER — Telehealth: Payer: Self-pay | Admitting: Oncology

## 2011-11-20 NOTE — Telephone Encounter (Signed)
S/w the pt and she is aware of her July appts 

## 2012-01-15 ENCOUNTER — Other Ambulatory Visit (HOSPITAL_BASED_OUTPATIENT_CLINIC_OR_DEPARTMENT_OTHER): Payer: Medicare HMO | Admitting: Lab

## 2012-01-15 DIAGNOSIS — Z170 Estrogen receptor positive status [ER+]: Secondary | ICD-10-CM

## 2012-01-15 DIAGNOSIS — C50919 Malignant neoplasm of unspecified site of unspecified female breast: Secondary | ICD-10-CM

## 2012-01-15 LAB — CBC WITH DIFFERENTIAL/PLATELET
BASO%: 0.7 % (ref 0.0–2.0)
EOS%: 1.6 % (ref 0.0–7.0)
HCT: 36.6 % (ref 34.8–46.6)
MCH: 32.5 pg (ref 25.1–34.0)
MCHC: 34.4 g/dL (ref 31.5–36.0)
MONO#: 0.6 10*3/uL (ref 0.1–0.9)
NEUT%: 67.4 % (ref 38.4–76.8)
RBC: 3.88 10*6/uL (ref 3.70–5.45)
RDW: 14 % (ref 11.2–14.5)
WBC: 6.9 10*3/uL (ref 3.9–10.3)
lymph#: 1.5 10*3/uL (ref 0.9–3.3)
nRBC: 0 % (ref 0–0)

## 2012-01-16 LAB — COMPREHENSIVE METABOLIC PANEL
ALT: 25 U/L (ref 0–35)
AST: 28 U/L (ref 0–37)
Albumin: 4.4 g/dL (ref 3.5–5.2)
Alkaline Phosphatase: 50 U/L (ref 39–117)
BUN: 16 mg/dL (ref 6–23)
Calcium: 10 mg/dL (ref 8.4–10.5)
Chloride: 103 mEq/L (ref 96–112)
Potassium: 3.9 mEq/L (ref 3.5–5.3)
Sodium: 138 mEq/L (ref 135–145)

## 2012-01-22 ENCOUNTER — Ambulatory Visit (HOSPITAL_BASED_OUTPATIENT_CLINIC_OR_DEPARTMENT_OTHER): Payer: Medicare HMO | Admitting: Oncology

## 2012-01-22 VITALS — BP 167/92 | HR 76 | Temp 98.3°F | Ht 64.0 in | Wt 147.1 lb

## 2012-01-22 DIAGNOSIS — Z853 Personal history of malignant neoplasm of breast: Secondary | ICD-10-CM

## 2012-01-22 DIAGNOSIS — C50919 Malignant neoplasm of unspecified site of unspecified female breast: Secondary | ICD-10-CM

## 2012-01-22 DIAGNOSIS — E559 Vitamin D deficiency, unspecified: Secondary | ICD-10-CM

## 2012-01-22 NOTE — Progress Notes (Signed)
Hematology and Oncology Follow Up Visit  OPHA MCGHEE [id number] [birth year] 56 y.o. 01/22/2012 3:25 PM   DIAGNOSIS: Breast cancer Encounter Diagnosis  Name Primary?  Marland Kitchen Unspecified vitamin D deficiency Yes     PAST THERAPY:  PROBLEMS:   1. History of node negative T2 N0 breast cancer diagnosed 11/15/1999 status post [facility] chemotherapy completed 02/20/2000, status post radiation therapy completed in August 2001, on tamoxifen for 5 years, now completing 5 years of Femara.   2. History of degenerative disk disease status post laminectomy. 3. History of ethanol use and tobacco use. 4. History of hypertension. 5.   Interim History: Since being seen last there have been no new intercurrent medical issues. She unfortunately is going through some difficult financial times and is having to sell her house. Aside from that she is doing fairly well. She is on the same medication as before. Mammography is been done on schedule. Medications: I have reviewed the patient's current medications.  Allergies: Allergies not on file  Past Medical History, Surgical history, Social history, and Family History were reviewed and updated.  Review of Systems: Constitutional:  Negative for fever, chills, night sweats, anorexia, weight loss, pain. Cardiovascular: no chest pain or dyspnea on exertion Respiratory: negative Neurological: negative Dermatological: negative ENT: negative Skin Gastrointestinal: negative Genito-Urinary: negative Hematological and Lymphatic: negative Breast: negative Musculoskeletal: negative Remaining ROS negative.  Physical Exam:  Blood pressure 167/92, pulse 76, temperature 98.3 F (36.8 C), height 5\' 4"  (1.626 m), weight 147 lb 1.6 oz (66.724 kg).  ECOG: 0 HEENT:  Sclerae anicteric, conjunctivae pink.  Oropharynx clear.  No mucositis or candidiasis.  Nodes:  No cervical, supraclavicular, or axillary lymphadenopathy palpated.  Breast Exam:  Right breast is benign.  No  masses, discharge, skin change, or nipple inversion.  Left breast is benign.  No masses, discharge, skin change, or nipple inversion..  Lungs:  Clear to auscultation bilaterally.  No crackles, rhonchi, or wheezes.  Heart:  Regular rate and rhythm.  Abdomen:  Soft, nontender.  Positive bowel sounds.  No organomegaly or masses palpated.  Musculoskeletal:  No focal spinal tenderness to palpation.  Extremities:  Benign.  No peripheral edema or cyanosis.  Skin:  Benign.  Neuro:  Nonfocal.     Lab Results: Lab Results  Component Value Date   WBC 6.9 01/15/2012   HGB 12.6 01/15/2012   HCT 36.6 01/15/2012   MCV 94.3 01/15/2012   PLT 350 01/15/2012     Chemistry      Component Value Date/Time   NA 138 01/15/2012 1418   K 3.9 01/15/2012 1418   CL 103 01/15/2012 1418   CO2 29 01/15/2012 1418   BUN 16 01/15/2012 1418   CREATININE 0.80 01/15/2012 1418      Component Value Date/Time   CALCIUM 10.0 01/15/2012 1418   ALKPHOS 50 01/15/2012 1418   AST 28 01/15/2012 1418   ALT 25 01/15/2012 1418   BILITOT 0.5 01/15/2012 1418       Radiological Studies:  No results found.   IMPRESSIONS AND PLAN: A 56 y.o. female with   History of node-negative ER/PR positive breast cancer and prolong followup. She is free of any obvious disease. We'll see her at her request an annual basis is appropriate imaging studies.  Spent more than half the time coordinating care, as well as discussion of BMI and its implications.      Blain Hunsucker 7/17/20133:25 PM Cell [phone]

## 2012-09-26 ENCOUNTER — Telehealth: Payer: Self-pay | Admitting: Oncology

## 2012-09-26 NOTE — Telephone Encounter (Signed)
S/W THE PT AND SHE IS AWARE OF HER REASSIGNMENT FROM DR RUBIN TO DR Darnelle Catalan. PT WILL RECEIVE AN APPT CALENDAR ALONG WITH A F/U LETTER IN THE MAIL.

## 2012-10-03 ENCOUNTER — Encounter: Payer: Self-pay | Admitting: Oncology

## 2013-01-20 ENCOUNTER — Encounter: Payer: Self-pay | Admitting: Family

## 2013-01-21 ENCOUNTER — Ambulatory Visit (HOSPITAL_BASED_OUTPATIENT_CLINIC_OR_DEPARTMENT_OTHER): Payer: Medicare HMO | Admitting: Family

## 2013-01-21 ENCOUNTER — Other Ambulatory Visit: Payer: Medicare HMO | Admitting: Lab

## 2013-01-21 ENCOUNTER — Encounter: Payer: Self-pay | Admitting: Family

## 2013-01-21 ENCOUNTER — Telehealth: Payer: Self-pay | Admitting: *Deleted

## 2013-01-21 ENCOUNTER — Ambulatory Visit: Payer: Medicare HMO | Admitting: Oncology

## 2013-01-21 ENCOUNTER — Other Ambulatory Visit (HOSPITAL_BASED_OUTPATIENT_CLINIC_OR_DEPARTMENT_OTHER): Payer: Medicare HMO | Admitting: Lab

## 2013-01-21 VITALS — BP 156/96 | HR 76 | Temp 98.4°F | Resp 20 | Ht 64.0 in | Wt 140.2 lb

## 2013-01-21 DIAGNOSIS — Z853 Personal history of malignant neoplasm of breast: Secondary | ICD-10-CM

## 2013-01-21 DIAGNOSIS — C50919 Malignant neoplasm of unspecified site of unspecified female breast: Secondary | ICD-10-CM

## 2013-01-21 DIAGNOSIS — C50911 Malignant neoplasm of unspecified site of right female breast: Secondary | ICD-10-CM

## 2013-01-21 DIAGNOSIS — E559 Vitamin D deficiency, unspecified: Secondary | ICD-10-CM

## 2013-01-21 LAB — CBC WITH DIFFERENTIAL/PLATELET
Basophils Absolute: 0.1 10*3/uL (ref 0.0–0.1)
Eosinophils Absolute: 0.1 10*3/uL (ref 0.0–0.5)
HGB: 13.6 g/dL (ref 11.6–15.9)
MCV: 96.2 fL (ref 79.5–101.0)
MONO#: 0.5 10*3/uL (ref 0.1–0.9)
MONO%: 8.4 % (ref 0.0–14.0)
NEUT#: 3.6 10*3/uL (ref 1.5–6.5)
RBC: 4.22 10*6/uL (ref 3.70–5.45)
RDW: 13.6 % (ref 11.2–14.5)
WBC: 5.6 10*3/uL (ref 3.9–10.3)
lymph#: 1.3 10*3/uL (ref 0.9–3.3)

## 2013-01-21 LAB — COMPREHENSIVE METABOLIC PANEL (CC13)
ALT: 19 U/L (ref 0–55)
Albumin: 4 g/dL (ref 3.5–5.0)
CO2: 27 mEq/L (ref 22–29)
Glucose: 106 mg/dl (ref 70–140)
Potassium: 4 mEq/L (ref 3.5–5.1)
Sodium: 135 mEq/L — ABNORMAL LOW (ref 136–145)
Total Bilirubin: 0.45 mg/dL (ref 0.20–1.20)
Total Protein: 7.2 g/dL (ref 6.4–8.3)

## 2013-01-21 NOTE — Telephone Encounter (Signed)
appts made and printed. The template for labs are not opened yet. appts made for [facility] for 03/26/13. And they stated that they cant schedule two years out...td

## 2013-01-21 NOTE — Patient Instructions (Addendum)
Please contact us at [phone] if you have any questions or concerns.  Please continue to do well and enjoy life!!!  Get plenty of rest, drink plenty of water, exercise daily (walking), eat a balanced diet.  Continue to take Vitamin D3 daily.   Results for orders placed in visit on 01/21/13 (from the past 72 hour(s))  CBC WITH DIFFERENTIAL     Status: None   Collection Time    01/21/13 12:17 PM      Result Value Range   WBC 5.6  3.9 - 10.3 10e3/uL   NEUT# 3.6  1.5 - 6.5 10e3/uL   HGB 13.6  11.6 - 15.9 g/dL   HCT 91.4  78.2 - 95.6 %   Platelets 312  145 - 400 10e3/uL   MCV 96.2  79.5 - 101.0 fL   MCH 32.2  25.1 - 34.0 pg   MCHC 33.5  31.5 - 36.0 g/dL   RBC 2.13  0.86 - 5.78 10e6/uL   RDW 13.6  11.2 - 14.5 %   lymph# 1.3  0.9 - 3.3 10e3/uL   MONO# 0.5  0.1 - 0.9 10e3/uL   Eosinophils Absolute 0.1  0.0 - 0.5 10e3/uL   Basophils Absolute 0.1  0.0 - 0.1 10e3/uL   NEUT% 65.1  38.4 - 76.8 %   LYMPH% 24.1  14.0 - 49.7 %   MONO% 8.4  0.0 - 14.0 %   EOS% 1.2  0.0 - 7.0 %   BASO% 1.2  0.0 - 2.0 %  COMPREHENSIVE METABOLIC PANEL (CC13)     Status: Abnormal   Collection Time    01/21/13 12:18 PM      Result Value Range   Sodium 135 (*) 136 - 145 mEq/L   Potassium 4.0  3.5 - 5.1 mEq/L   Chloride 99  98 - 109 mEq/L   CO2 27  22 - 29 mEq/L   Glucose 106  70 - 140 mg/dl   BUN 46.9  7.0 - 62.9 mg/dL   Creatinine 0.8  0.6 - 1.1 mg/dL   Total Bilirubin 5.28  0.20 - 1.20 mg/dL   Alkaline Phosphatase 61  40 - 150 U/L   AST 23  5 - 34 U/L   ALT 19  0 - 55 U/L   Total Protein 7.2  6.4 - 8.3 g/dL   Albumin 4.0  3.5 - 5.0 g/dL   Calcium 9.7  8.4 - 41.3 mg/dL

## 2013-01-21 NOTE — Progress Notes (Signed)
[facility]  Telephone:([phone]) [phone] [phone]  OFFICE PROGRESS NOTE   ID: Sherry Stone   DOB: [birth year]  MR#: [id number]  JYN#:[id number]   PCP: Sherry Palau, NP-C GYN: Varney Stone, M.D. SU: Sherry Stone, M.D. RAD ONC: Sherry Stone, M.D. GU:  Sherry Stone, M.D.   HISTORY OF PRESENT ILLNESS: From Sherry Stone's new patient evaluation note dated 11/26/1999: "Sherry Stone is a 57 year old white female.  This woman has been in good health all of her life.  She recently underwent routine screening mammography which identified an ill-defined density at the 9 o'clock position in the right breast.  Ultrasound and core biopsy performed on 11/08/1999 showed invasive ductal carcinoma.  The patient then underwent a needle localized excisional biopsy of the lesion on 11/15/1999.  The sentinel node evaluation was also performed.  Final results revealed a 3 cm moderately differentiated invasive ductal carcinoma.  Surgical margins were initially positive with invasive carcinoma involving the superior margin, intraductal carcinoma involving the anterior margin.  A separate fragment from the superior margin showed atypical ductal proliferation of the margin.  Histological grade as noted was 6 of 9.  3 sentinel lymph nodes were identified none of which had metastatic disease.  The patient had an unremarkable postoperative course.  ER/PR staining results are pending.  The patient denies any previous history with her breasts.  She denies any skin changes or nipple retraction."  Her subsequent history is as detailed below.   INTERVAL HISTORY: Dr. Darnelle Stone and I saw Sherry Stone today for followup of invasive carcinoma of the right breast .  The patient was last seen by Dr. Donnie Stone on 01/22/2012 .  Since her last office visit, the patient has been doing relatively well.  She is establishing herself with Sherry Stone service today.   REVIEW OF SYSTEMS: A 10 point review  of systems was completed and is negative except personal stressors and recent hematuria for which she is seeing Dr. Isabel Stone at [facility].  The patient denies any other symptomatology.  We discussed smoking cessation but the patient has not ready for smoking cessation at this time.   PAST MEDICAL HISTORY: Past Medical History  Diagnosis Date  . Breast cancer   . Pernicious anemia   . Degenerative disc disease   . C2 cervical fracture   . Substance abuse   . Hypertension   . Depression   . History of radiation therapy   . Hyperlipidemia   . Endometriosis     PAST SURGICAL HISTORY: Past Surgical History  Procedure Laterality Date  . Breast lumpectomy with axillary lymph node biopsy Right 11/15/1999  . Laminectomy  03/2003  . Spinal fusion  03/2003  . Tubal ligation  1993  . Ablation on endometriosis      FAMILY HISTORY Family History  Problem Relation Age of Onset  . Hypertension Mother   . Cancer Mother 68    Colon cancer  . Hypertension Father   . Hypertension Other   . Heart Problems Other   . Diabetes Other     GYNECOLOGIC HISTORY: Gravida 1, para 1, age of menarche 41, age of parity 18, menopause at the time of chemotherapy in 2001, the patient used birth control pills from age of 3 until her 20s, tubal ligation at age 14.   SOCIAL HISTORY: Mrs. Sherry Stone married her husband Sherry Stone "Sherry Stone" Physicist, medical in 1978.  Her husband is originally from [place] and she is from [place], [place].  They moved  to [place], [place] in 1982.  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  They have one adult son who resides in [place], [place].  In her spare time Mrs. Sherry Stone enjoys shopping, traveling, attending church services at [facility] and spending time with her family and friends.   ADVANCED DIRECTIVES: Not on file  HEALTH MAINTENANCE: History  Substance Use Topics  .  Smoking status: Current Every Day Smoker -- 0.50 packs/day for 43 years    Types: Cigarettes  . Smokeless tobacco: Never Used  . Alcohol Use: No     Comment: No alcohol since 08/23/2007    Colonoscopy: 05/24/2011 PAP: Not on file Bone density: Not on file Lipid panel: Not on file  Allergies  Allergen Reactions  . Prednisone Palpitations    Current Outpatient Prescriptions  Medication Sig Dispense Refill  . bisoprolol (ZEBETA) 10 MG tablet Take 10 mg by mouth.       . Cholecalciferol (VITAMIN D) 2000 UNITS tablet Take 2,000 Units by mouth daily.      Marland Kitchen gabapentin (NEURONTIN) 300 MG capsule Take 300 mg by mouth 2 (two) times daily.       . simvastatin (ZOCOR) 40 MG tablet Take 40 mg by mouth every evening.      . venlafaxine (EFFEXOR) 75 MG tablet Take 75 mg by mouth 3 (three) times daily.      . vitamin B-12 (CYANOCOBALAMIN) 1000 MCG tablet Take 2,000 mcg by mouth daily.       No current facility-administered medications for this visit.    OBJECTIVE: Filed Vitals:   01/21/13 1238  BP: 156/96  Pulse: 76  Temp: 98.4 F (36.9 C)  Resp: 20     Body mass index is 24.05 kg/(m^2).      ECOG FS: 1 - Symptomatic but completely ambulatory  General appearance: Alert, cooperative, well nourished, no apparent distress Head: Normocephalic, without obvious abnormality, atraumatic Eyes: Arcus senilis, PERRLA, EOMI, left eye pterygium  Nose: Nares, septum and mucosa are normal, no drainage or sinus tenderness Neck: No adenopathy, supple, symmetrical, trachea midline, thyroid not enlarged, no tenderness Resp: Clear to auscultation bilaterally Cardio: Regular rate and rhythm, S1, S2 normal, no murmur, click, rub or gallop Breasts: Right breast and axillary area have well-healed surgical scars, right breast is visibly smaller than left breast, no lymphadenopathy, left breast inverted nipple, no axilla fullness GI: Soft, distended, non-tender, normoactive bowel sounds, no  organomegaly Extremities: Extremities normal, atraumatic, no cyanosis or edema Lymph nodes: Cervical, supraclavicular, and axillary nodes normal Neurologic: Grossly normal    LAB RESULTS: Lab Results  Component Value Date   WBC 5.6 01/21/2013   NEUTROABS 3.6 01/21/2013   HGB 13.6 01/21/2013   HCT 40.6 01/21/2013   MCV 96.2 01/21/2013   PLT 312 01/21/2013      Chemistry      Component Value Date/Time   NA 135* 01/21/2013 1218   NA 138 01/15/2012 1418   K 4.0 01/21/2013 1218   K 3.9 01/15/2012 1418   CL 103 01/15/2012 1418   CO2 27 01/21/2013 1218   CO2 29 01/15/2012 1418   BUN 14.3 01/21/2013 1218   BUN 16 01/15/2012 1418   CREATININE 0.8 01/21/2013 1218   CREATININE 0.80 01/15/2012 1418      Component Value Date/Time   CALCIUM 9.7 01/21/2013 1218   CALCIUM 10.0 01/15/2012 1418   ALKPHOS 61 01/21/2013 1218  ALKPHOS 50 01/15/2012 1418   AST 23 01/21/2013 1218   AST 28 01/15/2012 1418   ALT 19 01/21/2013 1218   ALT 25 01/15/2012 1418   BILITOT 0.45 01/21/2013 1218   BILITOT 0.5 01/15/2012 1418       Lab Results  Component Value Date   LABCA2 24 11/07/2010    Urinalysis No results found for this basename: colorurine,  appearanceur,  labspec,  phurine,  glucoseu,  hgbur,  bilirubinur,  ketonesur,  proteinur,  urobilinogen,  nitrite,  leukocytesur    STUDIES: 1.  Her last bilateral digital screening mammogram on 03/25/2012 showed no abnormal findings or significant changes from prior exams.  Surgical clips are noted in the right breast in the upper-outer quadrant.  No significant findings.  Benign findings.   ASSESSMENT: Mrs. Corella is a 57 y.o. [place] woman: 1.  Status post right breast needle core biopsy on 11/07/1999 which showed invasive ductal carcinoma with foci of in situ ductal carcinoma and microcalcifications.  2.  Status post right breast wire/needle localized lumpectomy with right axillary sentinel lymph node biopsy on 11/15/1999 for a stage IIA, pT2,  pN0, pMX, 3.0 cm moderately differentiated invasive ductal carcinoma with intraductal carcinoma involving the anterior margin and invasive carcinoma involving the superior margin, grade 2, the tumor was strongly positive for estrogen and progesterone receptors with a low proliferation fraction, no overexpression of HER-2 was detected, the tumor was DNA diploid, with 0/3 metastatic right axillary lymph nodes.   3.  Status post adjuvant chemotherapy with AC (Adriamycin/Cytoxan)  x 4 cycles completed in 02/2000.  4.  The patient completed adjuvant radiation therapy in 04/2000.  5.  The patient started antiestrogen therapy with Tamoxifen in 04/2000 x 5 years.  She had antiestrogen therapy with Femara from 04/2005 through 12/2010.  6.  Hematuria   PLAN: The patient has hematuria for which he is being followed by Urologist Dr. Isabel Stone at [facility].  The patient is not ready to graduate [facility] breast cancer program at this time and would like to continue to be seen by [place] on an annual basis.  Accordingly, we plan to see Mrs. Willhoite again in 04/2014 after her bilateral digital screening mammogram in 03/2014.  We will schedule the patient for a bone density scan in addition to her annual screening mammogram in 03/2014.  We will also check laboratories of CBC, CMP, LDH, and vitamin D level during her next office visit.  All questions were answered.  The patient was encouraged to contact us in the interim with any problems, questions or concerns.    Larina Bras, NP-C 01/22/2013, 4:04 PM  ADDENDUM: This 57 year old [place] woman establish herself in my practice today. We reviewed her diagnosis, treatment history and prognosis. In brief:  She underwent right lumpectomy and axillary lymph node sampling May of 2001 for a pT2 pN0, stage IIA invasive ductal carcinoma, grade 2, with involved margins (invasive and noninvasive). The cancer cells were estrogen receptor positive at 82%, progesterone  receptor +96%, with an MIB-1 of 4% and no HER-2 amplification.  She was treated adjuvantly with doxorubicin and cyclophosphamide x4 completed in August of 2001, with adjuvant radiation completed October of 2001. She took tamoxifen for 5 years, 2 October of 2006 and then switched to letrozole which she continued until June of 2012.  I would be comfortable releasing Rasha to her primary care physician, but she gets a real benefit from coming to our office and being reassured regarding her cancer. As  soon as possible he developed a survivorship clinic she would be an excellent candidate for it. Until then we will continue to see her on a yearly basis, after her September mammography. She knows to call for any problems that may develop before her next visit here.   I personally saw this patient and performed a substantive portion of this encounter with the listed APP documented above.   Lowella Dell, MD

## 2013-01-22 ENCOUNTER — Encounter: Payer: Self-pay | Admitting: Family

## 2013-01-22 LAB — VITAMIN D 25 HYDROXY (VIT D DEFICIENCY, FRACTURES): Vit D, 25-Hydroxy: 55 ng/mL (ref 30–89)

## 2013-01-24 ENCOUNTER — Other Ambulatory Visit: Payer: Self-pay | Admitting: Oncology

## 2013-04-03 ENCOUNTER — Other Ambulatory Visit: Payer: Self-pay | Admitting: Oncology

## 2013-04-03 NOTE — Progress Notes (Signed)
Checked w Dr Jamey Ripa re pts margins--they were reexcsised at the original surgery and was negative. 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

## 2014-04-25 ENCOUNTER — Ambulatory Visit: Payer: Medicare HMO | Admitting: Oncology

## 2021-12-31 ENCOUNTER — Encounter: Payer: Self-pay | Admitting: Internal Medicine

## 2021-12-31 ENCOUNTER — Ambulatory Visit (INDEPENDENT_AMBULATORY_CARE_PROVIDER_SITE_OTHER): Payer: Medicare Other | Admitting: Internal Medicine

## 2021-12-31 VITALS — BP 116/76 | HR 68 | Temp 98.2°F | Ht 62.5 in | Wt 143.0 lb

## 2021-12-31 DIAGNOSIS — Z720 Tobacco use: Secondary | ICD-10-CM | POA: Diagnosis not present

## 2021-12-31 DIAGNOSIS — G4733 Obstructive sleep apnea (adult) (pediatric): Secondary | ICD-10-CM

## 2021-12-31 DIAGNOSIS — J449 Chronic obstructive pulmonary disease, unspecified: Secondary | ICD-10-CM

## 2021-12-31 NOTE — Assessment & Plan Note (Signed)
Cessation advised. 

## 2021-12-31 NOTE — Assessment & Plan Note (Signed)
She volunteers diagnosis and continues to smoke. Currently managed by her PCP.  We can help if needed.

## 2022-01-25 ENCOUNTER — Ambulatory Visit: Payer: Medicare Other

## 2022-01-25 DIAGNOSIS — G4733 Obstructive sleep apnea (adult) (pediatric): Secondary | ICD-10-CM

## 2022-02-01 DIAGNOSIS — G4733 Obstructive sleep apnea (adult) (pediatric): Secondary | ICD-10-CM

## 2022-02-15 ENCOUNTER — Telehealth: Payer: Self-pay | Admitting: Internal Medicine

## 2022-02-15 DIAGNOSIS — G4733 Obstructive sleep apnea (adult) (pediatric): Secondary | ICD-10-CM

## 2022-02-15 NOTE — Telephone Encounter (Signed)
Called and spoke with patient. Patient stated that she wanted to know what her results were on the sleep study.   CY, please advise.

## 2022-02-18 NOTE — Telephone Encounter (Signed)
Called and spoke with patient. She verbalized understanding.   Nothing further needed.  

## 2022-02-18 NOTE — Telephone Encounter (Signed)
Her home sleep study showed obstructive sleep apnea averaging 15-16 apneas/ hour.  I recommend we order DME [facility]:     Please replace old CPAP machine, changee to auto 5-15, mask of choice, humidifier, supplies, AirView/ card  Thanks

## 2022-05-01 NOTE — Progress Notes (Signed)
12/31/21- 79 yoF Smoker (21 pkyrs) for sleep evaluation courtesy of Vicenta Aly, FNP, with concern of OSA Medical problem list includes HTN,  COPD, C2 cervical fx hx, DDD, Breast Cancer/ XRT, Hyperlipidemia, Pernicious Anemia, Depression,  NPSG 10/14/05- AHI 66.7/ hr. Desaturation to 86%, CPAP to 13, body weight 144 lbs Epworth score-2 Body weight today-143 lbs Covid vax-2 Phizer Last sleep study > 10 yrs ago. Uses CPAP w/o humidifier. -----She is on a CPAP currently. She has her own machine, she feels tired during the day and does not sleep well.  Machine is original, probably 68 years old, through [place]. Still gets supplies. Husband here, says she does not snore through it.  ENT surgery for tonsils. Known COPD of unknown severity. Using melatonin 20 mg for sleep onset and maintenance insomnia. She agrees best plan is to get home sleep test and reassess.  Husband is our patient, on CPAP.  05/02/22- 66 yoF Smoker (21 pkyrs) followed for OSA, complicated by HTN,  COPD, C2 cervical fx hx, DDD, Breast Cancer/ XRT, Hyperlipidemia, Pernicious Anemia, Depression,  -Advair hfa 115/21, Singulair CPAP auto 5-15/ [employer]  AirSense 11   replaced old machine- ordered 02/18/22 Download compliance 100%, AHI 1.8/ hr Body weight today-135 lbs Covid vax- Flu vax- -----Pt states new CPAP machine is great! Download reviewed.  She is quite comfortable with her CPAP and sleeps better with it. Continues to smoke we discussed smoking cessation and support. Uses Advair and rescue inhaler intermittently.  She is aware PCP diagnosed COPD. Hopes to renew manufacturer's assistance for Advair HFA. Requests flu vaccine and pneumonia vaccine.  Agrees to CXR.  ROS-see HPI   + = positive Constitutional:    weight loss, night sweats, fevers, chills, fatigue, lassitude. HEENT:    headaches, difficulty swallowing, +tooth/dental problems, sore throat,      + sneezing, itching, ear ache, nasal congestion, post nasal drip,  snoring CV:    chest pain, orthopnea, PND, swelling in lower extremities, anasarca,                                   dizziness, palpitations Resp:   +shortness of breath with exertion or at rest.                productive cough,   non-productive cough, coughing up of blood.              change in color of mucus.  wheezing.   Skin:    rash or lesions. GI:  No-   heartburn, indigestion, abdominal pain, nausea, vomiting, diarrhea,                 change in bowel habits, loss of appetite GU: dysuria, change in color of urine, no urgency or frequency.   flank pain. MS:   joint pain, stiffness, decreased range of motion, back pain. Neuro-     nothing unusual Psych:  change in mood or affect. + depression or anxiety.   memory loss.  OBJ- Physical Exam General- Alert, Oriented, Affect-appropriate, Distress- none acute Skin- rash-none, lesions- none, excoriation- none Lymphadenopathy- none Head- atraumatic            Eyes- Gross vision intact, PERRLA, conjunctivae and secretions clear            Ears- Hearing, canals-normal            Nose- Clear, no-Septal dev, mucus, polyps, erosion, perforation  Throat- Mallampati III-IV , mucosa clear , drainage- none, tonsils- atrophic, +dentures Neck- flexible , trachea midline, no stridor , thyroid nl, carotid no bruit Chest - symmetrical excursion , unlabored           Heart/CV- RRR , no murmur , no gallop  , no rub, nl s1 s2                           - JVD- none , edema- none, stasis changes- none, varices- none           Lung- clear to P&A/ distant, wheeze- none, cough- none , dullness-none, rub- none           Chest wall-  Abd-  Br/ Gen/ Rectal- Not done, not indicated Extrem- cyanosis- none, clubbing, none, atrophy- none, strength- nl Neuro- grossly intact to observation

## 2022-05-02 ENCOUNTER — Encounter: Payer: Self-pay | Admitting: Internal Medicine

## 2022-05-02 ENCOUNTER — Ambulatory Visit (INDEPENDENT_AMBULATORY_CARE_PROVIDER_SITE_OTHER): Payer: Medicare Other

## 2022-05-02 ENCOUNTER — Ambulatory Visit (INDEPENDENT_AMBULATORY_CARE_PROVIDER_SITE_OTHER): Payer: Medicare Other | Admitting: Internal Medicine

## 2022-05-02 VITALS — BP 142/90 | HR 77 | Ht 63.0 in | Wt 135.0 lb

## 2022-05-02 DIAGNOSIS — G4733 Obstructive sleep apnea (adult) (pediatric): Secondary | ICD-10-CM | POA: Diagnosis not present

## 2022-05-02 DIAGNOSIS — Z23 Encounter for immunization: Secondary | ICD-10-CM | POA: Diagnosis not present

## 2022-05-02 DIAGNOSIS — Z720 Tobacco use: Secondary | ICD-10-CM

## 2022-05-02 DIAGNOSIS — J449 Chronic obstructive pulmonary disease, unspecified: Secondary | ICD-10-CM | POA: Diagnosis not present

## 2022-05-02 NOTE — Patient Instructions (Signed)
Order- apply for manufacturer's assistance again for Advair 115/ 21 hfA  Order- Prevnar 20 pneumonia vaccine  Order- Flu vaccine senior  Order- CXR   dx COPD mixed type  Order- Refer to Eric Form team   Low Dose screening chest CT program

## 2022-05-03 NOTE — Assessment & Plan Note (Signed)
Benefits from CPAP with good compliance and control.  Likes new machine. Plan continue auto 5-15

## 2022-05-03 NOTE — Assessment & Plan Note (Signed)
Encourage efforts to quit.  She needs to be motivated but support is available.

## 2022-05-03 NOTE — Assessment & Plan Note (Addendum)
Emphasized smoking cessation. Plan-applied to renew manufacturer's assistance for Advair.  CXR.  Low-dose screening CT program.

## 2022-11-22 ENCOUNTER — Other Ambulatory Visit: Payer: Self-pay

## 2022-11-22 DIAGNOSIS — Z87891 Personal history of nicotine dependence: Secondary | ICD-10-CM

## 2022-11-22 DIAGNOSIS — F17210 Nicotine dependence, cigarettes, uncomplicated: Secondary | ICD-10-CM

## 2022-12-20 ENCOUNTER — Ambulatory Visit (INDEPENDENT_AMBULATORY_CARE_PROVIDER_SITE_OTHER): Payer: Medicare Other | Admitting: Physician Assistant

## 2022-12-20 ENCOUNTER — Encounter: Payer: Self-pay | Admitting: Physician Assistant

## 2022-12-20 DIAGNOSIS — F17210 Nicotine dependence, cigarettes, uncomplicated: Secondary | ICD-10-CM | POA: Diagnosis not present

## 2022-12-20 NOTE — Patient Instructions (Signed)
Thank you for participating in the [facility]. It was our pleasure to meet you today. We will call you with the results of your scan within the next few days. Your scan will be assigned a Lung RADS category score by the physicians reading the scans.  This Lung RADS score determines follow up scanning.  See below for description of categories, and follow up screening recommendations. We will be in touch to schedule your follow up screening annually or based on recommendations of our providers. We will fax a copy of your scan results to your Primary Care Physician, or the physician who referred you to the program, to ensure they have the results. Please call the office if you have any questions or concerns regarding your scanning experience or results.  Our office number is [phone]. Please speak with Denise Phelps, RN. , or  Denise Buckner RN, They are  our Lung Cancer Screening RN.'s If They are unavailable when you call, Please leave a message on the voice mail. We will return your call at our earliest convenience.This voice mail is monitored several times a day.  Remember, if your scan is normal, we will scan you annually as long as you continue to meet the criteria for the program. (Age 50-80, Current smoker or smoker who has quit within the last 15 years). If you are a smoker, remember, quitting is the single most powerful action that you can take to decrease your risk of lung cancer and other pulmonary, breathing related problems. We know quitting is hard, and we are here to help.  Please let us know if there is anything we can do to help you meet your goal of quitting. If you are a former smoker, congratulations. We are proud of you! Remain smoke free! Remember you can refer friends or family members through the number above.  We will screen them to make sure they meet criteria for the program. Thank you for helping us take better care of you by  participating in Lung Screening.  You can receive free nicotine replacement therapy ( patches, gum or mints) by calling 1-800-QUIT NOW. Please call so we can get you on the path to becoming  a non-smoker. I know it is hard, but you can do this!  Lung RADS Categories:  Lung RADS 1: no nodules or definitely non-concerning nodules.  Recommendation is for a repeat annual scan in 12 months.  Lung RADS 2:  nodules that are non-concerning in appearance and behavior with a very low likelihood of becoming an active cancer. Recommendation is for a repeat annual scan in 12 months.  Lung RADS 3: nodules that are probably non-concerning , includes nodules with a low likelihood of becoming an active cancer.  Recommendation is for a 6-month repeat screening scan. Often noted after an upper respiratory illness. We will be in touch to make sure you have no questions, and to schedule your 6-month scan.  Lung RADS 4 A: nodules with concerning findings, recommendation is most often for a follow up scan in 3 months or additional testing based on our provider's assessment of the scan. We will be in touch to make sure you have no questions and to schedule the recommended 3 month follow up scan.  Lung RADS 4 B:  indicates findings that are concerning. We will be in touch with you to schedule additional diagnostic testing based on our provider's  assessment of the scan.  Other options for assistance in smoking cessation (   As covered by your insurance benefits)  Hypnosis for smoking cessation  [employer] [phone]  Acupuncture for smoking cessation  [facility] [phone]   

## 2022-12-20 NOTE — Progress Notes (Signed)
Virtual Visit via Telephone Note  I connected with Marvetta Gibbons on 12/20/22 at  11:15 AM  by telephone and verified that I am speaking with the correct person using two identifiers.  Location: Patient: home Provider: working virtually from home   I discussed the limitations, risks, security and privacy concerns of performing an evaluation and management service by telephone and the availability of in person appointments. I also discussed with the patient that there may be a patient responsible charge related to this service. The patient expressed understanding and agreed to proceed.     Shared Decision Making Visit Lung Cancer Screening Program [phone])   Eligibility: Age 67 Pack Years Smoking History Calculation 31 (# packs/per year x # years smoked) Recent History of coughing up blood  No Unexplained weight loss? No ( >Than 15 pounds within the last 6 months ) Prior History Lung / other cancer No (Diagnosis within the last 5 years already requiring surveillance chest CT Scans). Smoking Status Current Smoker  Visit Components: Discussion included one or more decision making aids. Yes Discussion included risk/benefits of screening. Yes Discussion included potential follow up diagnostic testing for abnormal scans. Yes Discussion included meaning and risk of over diagnosis. Yes Discussion included meaning and risk of False Positives. Yes Discussion included meaning of total radiation exposure. Yes  Counseling Included: Importance of adherence to annual lung cancer LDCT screening. Yes Impact of comorbidities on ability to participate in the program. Yes Ability and willingness to under diagnostic treatment: Yes  Smoking Cessation Counseling: Current Smokers:  Discussed importance of smoking cessation. Yes Information about tobacco cessation classes and interventions provided to patient. Yes Symptomatic Patient. No Diagnosis Code: Tobacco Use Z72.0 Asymptomatic Patient  Yes  Counseling (Intermediate counseling: > three minutes counseling) U0454 Information about tobacco cessation classes and interventions provided to patient. Yes Written Order for Lung Cancer Screening with LDCT placed in Epic. Yes (CT Chest Lung Cancer Screening Low Dose W/O CM) [id number] Z12.2-Screening of respiratory organs Z87.891-Personal history of nicotine dependence   I have spent 25 minutes of face to face/ virtual visit  time with the patient discussing the risks and benefits of lung cancer screening. We discussed the above noted topics. We paused at intervals to allow for questions to be asked and answered to ensure understanding.We discussed that the single most powerful action that anyone can take to decrease their risk of developing lung cancer is to quit smoking.  We discussed options for tools to aid in quitting smoking including nicotine replacement therapy, non-nicotine medications, support groups, Quit Smart classes, and behavior modification. We discussed that often times setting smaller, more achievable goals, such as eliminating 1 cigarette a day for a week and then 2 cigarettes a day for a week can be helpful in slowly decreasing the number of cigarettes smoked. I provided  them  with smoking cessation  information  with contact information for community resources, classes, free nicotine replacement therapy, and access to mobile apps, text messaging, and on-line smoking cessation help. I have also provided  them  the office contact information in the event they have any questions. We discussed the time and location of the scan, and that either Abigail Miyamoto RN, Karlton Lemon, RN  or I will call / send a letter with the results within 24-72 hours of receiving them. The patient verbalized understanding of all of  the above and had no further questions upon leaving the office. They have my contact information in the event they have any  further questions.  I spent three minutes  counseling on smoking cessation and the health risks of continued tobacco abuse.  I explained to the patient that there has been a high incidence of coronary artery disease noted on these exams. I explained that this is a non-gated exam therefore degree or severity cannot be determined. This patient is on statin therapy. I have asked the patient to follow-up with their PCP regarding any incidental finding of coronary artery disease and management with diet or medication as their PCP  feels is clinically indicated. The patient verbalized understanding of the above and had no further questions upon completion of the visit.    Darcella Gasman Jillianne Gamino, PA-C

## 2022-12-24 ENCOUNTER — Ambulatory Visit (HOSPITAL_BASED_OUTPATIENT_CLINIC_OR_DEPARTMENT_OTHER)
Admission: RE | Admit: 2022-12-24 | Discharge: 2022-12-24 | Disposition: A | Discharge status: Home / Self Care | Payer: Medicare Other | Source: Ambulatory Visit | Attending: Acute Care | Admitting: Acute Care

## 2022-12-24 DIAGNOSIS — F17210 Nicotine dependence, cigarettes, uncomplicated: Secondary | ICD-10-CM | POA: Diagnosis present

## 2022-12-24 DIAGNOSIS — Z122 Encounter for screening for malignant neoplasm of respiratory organs: Secondary | ICD-10-CM | POA: Diagnosis not present

## 2022-12-24 DIAGNOSIS — Z87891 Personal history of nicotine dependence: Secondary | ICD-10-CM

## 2022-12-30 ENCOUNTER — Telehealth: Payer: Self-pay | Admitting: Acute Care

## 2022-12-30 NOTE — Telephone Encounter (Signed)
Call Report  

## 2023-01-03 ENCOUNTER — Telehealth: Payer: Self-pay | Admitting: Acute Care

## 2023-01-03 ENCOUNTER — Other Ambulatory Visit: Payer: Self-pay | Admitting: Acute Care

## 2023-01-03 DIAGNOSIS — Z87891 Personal history of nicotine dependence: Secondary | ICD-10-CM

## 2023-01-03 DIAGNOSIS — Z122 Encounter for screening for malignant neoplasm of respiratory organs: Secondary | ICD-10-CM

## 2023-01-03 DIAGNOSIS — F17210 Nicotine dependence, cigarettes, uncomplicated: Secondary | ICD-10-CM

## 2023-01-03 DIAGNOSIS — R911 Solitary pulmonary nodule: Secondary | ICD-10-CM

## 2023-01-03 NOTE — Telephone Encounter (Signed)
I have called the patient with the results of her low dose Ct Chest. I explained that her scan was read as a LR 4A, . She has a LLL that has grown in size from 7-8 mm ( Per scans from [facility]) , and is now 10.8 mm, so we will go ahead and PET scan it. She is in agreement with this plan. Order has been placed. She understands she will get a call to get this scheduled.

## 2023-01-03 NOTE — Telephone Encounter (Signed)
I have attempted to call the patient with the results of her low dose CT Chest. Her husband answered the phone and requested I call back in 15-20 minutes as patient is on another call.  Robynn Pane, I will try again at 3:30. She needs a PET scan and then follow up with me in the office to review the results. I will place the order once I get in touch with her. Please fax results to PCP and let them know plan is for a PET scan. Please add to tickle list so we can determine if she will need to return to the screening population if the scan results are negative. Thanks so much.

## 2023-01-03 NOTE — Telephone Encounter (Signed)
See other telephone note from 01/03/23

## 2023-01-06 NOTE — Telephone Encounter (Signed)
See other telephone note from 01/03/23 

## 2023-01-22 ENCOUNTER — Encounter (HOSPITAL_COMMUNITY): Admission: RE | Admit: 2023-01-22 | Payer: Medicare Other | Source: Ambulatory Visit

## 2023-01-22 ENCOUNTER — Encounter (HOSPITAL_COMMUNITY): Payer: Self-pay

## 2023-01-23 ENCOUNTER — Ambulatory Visit (HOSPITAL_COMMUNITY)
Admission: RE | Admit: 2023-01-23 | Discharge: 2023-01-23 | Disposition: A | Discharge status: Home / Self Care | Payer: Medicare Other | Source: Ambulatory Visit | Attending: Acute Care | Admitting: Acute Care

## 2023-01-23 DIAGNOSIS — R911 Solitary pulmonary nodule: Secondary | ICD-10-CM | POA: Insufficient documentation

## 2023-01-23 LAB — GLUCOSE, CAPILLARY: Glucose-Capillary: 116 mg/dL — ABNORMAL HIGH (ref 70–99)

## 2023-01-23 MED ORDER — FLUDEOXYGLUCOSE F - 18 (FDG) INJECTION
6.6000 | Freq: Once | INTRAVENOUS | Status: AC | PRN
  Administered 2023-01-23: 6.6 via INTRAVENOUS

## 2023-01-28 ENCOUNTER — Ambulatory Visit: Payer: Medicare Other | Admitting: Acute Care

## 2023-01-30 ENCOUNTER — Encounter: Payer: Self-pay | Admitting: Acute Care

## 2023-01-30 ENCOUNTER — Ambulatory Visit (INDEPENDENT_AMBULATORY_CARE_PROVIDER_SITE_OTHER): Payer: Medicare Other | Admitting: Acute Care

## 2023-01-30 DIAGNOSIS — J449 Chronic obstructive pulmonary disease, unspecified: Secondary | ICD-10-CM

## 2023-01-30 DIAGNOSIS — R918 Other nonspecific abnormal finding of lung field: Secondary | ICD-10-CM | POA: Diagnosis not present

## 2023-01-30 DIAGNOSIS — F17210 Nicotine dependence, cigarettes, uncomplicated: Secondary | ICD-10-CM | POA: Diagnosis not present

## 2023-01-30 DIAGNOSIS — Z853 Personal history of malignant neoplasm of breast: Secondary | ICD-10-CM | POA: Diagnosis not present

## 2023-01-30 NOTE — Telephone Encounter (Signed)
Patient had visit with Kandice Robinsons, NP, on 01/30/2023 and advised to resume annual LDCT based of PET results. New order placed for 01/2024 annual LDCT.

## 2023-01-30 NOTE — Patient Instructions (Addendum)
It was good to speak with you today. Your PET scan was negative for hypermetabolism, which is great news. We suspect the finding of the nodule is due to a benign cause. Plan will be to return to annual screening population next scan due 01/2024. You will get a call closer to the time to get this scheduled.  Call for any blood in sputum, or unintentional weight loss Call if you need [place] sooner. Continue Advair daily as you have been doing Rinse mouth after use Continue Albuterol as needed for breakthrough shortness of breath. Note your daily symptoms > remember "red flags" for COPD:  Increase in cough, increase in sputum production, increase in shortness of breath or activity intolerance. If you notice these symptoms, please call to be seen.   Please continue to work on quitting smoking.  I know this is hard. As discussed today on the phone , please call the below numbers for options to help with smoking cessation.  Please let us know if there is anything we can do to help with this journey. You can receive free nicotine replacement therapy ( patches, gum or mints) by calling 1-800-QUIT NOW. Please call so we can get you on the path to becoming  a non-smoker. I know it is hard, but you can do this!   Other options for assistance in smoking cessation ( As covered by your insurance benefits)   Hypnosis for smoking cessation  [employer] [phone]   Acupuncture for smoking cessation  [employer] [phone]

## 2023-01-30 NOTE — Progress Notes (Addendum)
Virtual Visit via Telephone Note  I connected with Sherry Stone on 01/30/23 at  9:30 AM EDT by telephone and verified that I am speaking with the correct person using two identifiers.  Location: Patient:  At home  Provider:  [address] [place], [place], [address]    I discussed the limitations, risks, security and privacy concerns of performing an evaluation and management service by telephone and the availability of in person appointments. I also discussed with the patient that there may be a patient responsible charge related to this service. The patient expressed understanding and agreed to proceed.   History of Present Illness: 67 year old female current every day smoker ( 48 pack year smoking history)  followed by the lung cancer screening program. Medical problem list includes HTN, COPD, C2 cervical fx hx, DDD, Breast Cancer/ XRT, Hyperlipidemia, Pernicious Anemia, Depression. She had a Low Dose CT Chest 12/24/2022 which showed a 10.8 mm pulmonary nodule in the left lower lobe that was concerning for bronchogenic carcinoma. Pt. Denies being sick, or having any symptoms of a COPD flare. With her history of  breast cancer  a PET scan was ordered. She had been scheduled to be seen in the office 01/28/2023 to review the scan results, but the scan had not yet been read. Today's televisit is to review PET scan results.   We reviewed her results. PET imaging shows no hypermetabolic activity in the nodule of concern,  favoring benign etiology which is great news. Plan will be for return to annual screening population next scan due 01/2024. She understands we will call her to get this scheduled closer to the time.I have counseled her on smoking cessation. We discussed that she is working on cutting down. She has gone up to 12 days without smoking when she had dental work recently. We talked about being self aware when she is bored to keep her hands busy.I will send her information in the AVS on  smoking cessation. Free nicotine replacement therapy, hypnosis and acupuncture.  She states she really would like to quit. I counseled her for 3-4 minutes this visit.  She was audibly wheezing on the phone this morning. She stated this is usual for her in the morning. She states when she takes a steamy shower this always clears. She did not feel she was flaring. She is compliant with her Advair daily, and uses her albuterol rescue as needed     Observations/Objective: 01/23/2023 PET Scan CHEST: LEFT lobe pulmonary nodule measuring 9 mm (image 29/series 7) is no associated metabolic activity. This is the nodule concern on comparison lung cancer screening exam. There is some improvement in the adjacent interstitial thickening within the LEFT lower lobe. No hypermetabolic mediastinal lymph nodes. No metabolic activity associated with the LEFT lower lobe pulmonary nodule. Favor benign etiology. Recommend resumption of low-dose annual lung cancer screening. Improvement in the adjacent interstitial thickening within the LEFT lower lobe. Benign LEFT adrenal adenoma.  LDCT 12/2022 Centrilobular and paraseptal emphysema evident. Bilateral pulmonary nodules are identified. Dominant nodule is 10.8 mm in the paraspinal left lower lobe Lung-RADS 4A, suspicious. 10.8 mm left lower lobe pulmonary nodule. Follow up low-dose chest CT without contrast in 3 months  Assessment and Plan: 10.8 mm LLL pulmonary Nodule in a current every day smoker with history of Right sided breast cancer in 2001.  No hypermetabolism on PET COPD Plan Return to annual screening population next scan due 01/2024. You will get a call closer to  the time to get this scheduled.  Call for any blood in sputum, or unintentional weight loss Call if you need [place] sooner. Continue Advair daily as you have been doing Rinse mouth after use Continue Albuterol as needed for breakthrough shortness of breath. Note your daily symptoms >  remember "red flags" for COPD:  Increase in cough, increase in sputum production, increase in shortness of breath or activity intolerance. If you notice these symptoms, please call to be seen.   Please continue to work on quitting smoking.  As discussed today, please call the below numbers for options to help with smoking cessation.  Please let us know if there is anything we can do to help with this journey. You can receive free nicotine replacement therapy ( patches, gum or mints) by calling 1-800-QUIT NOW. Please call so we can get you on the path to becoming  a non-smoker. I know it is hard, but you can do this!  Other options for assistance in smoking cessation ( As covered by your insurance benefits)  Hypnosis for smoking cessation  [employer] [phone]  Acupuncture for smoking cessation  [employer] [phone]   Follow Up Instructions: Return to annual screening population next scan due 01/2024. You will get a call closer to the time to get this scheduled.    I discussed the assessment and treatment plan with the patient. The patient was provided an opportunity to ask questions and all were answered. The patient agreed with the plan and demonstrated an understanding of the instructions.   The patient was advised to call back or seek an in-person evaluation if the symptoms worsen or if the condition fails to improve as anticipated.  I spent 30 minutes dedicated to the care of this patient on the date of this encounter to include pre-visit review of records, non-face-to-face time with the patient discussing conditions above.  Total time spend managing patient care was 36 minutes. I have been asked by telehealth to addend my note to the time spent on the phone only.   Bevelyn Ngo, NP  01/30/2023 10:06 AM

## 2024-01-19 ENCOUNTER — Other Ambulatory Visit: Payer: Self-pay | Admitting: Acute Care

## 2024-01-19 DIAGNOSIS — Z87891 Personal history of nicotine dependence: Secondary | ICD-10-CM

## 2024-01-19 DIAGNOSIS — F17210 Nicotine dependence, cigarettes, uncomplicated: Secondary | ICD-10-CM

## 2024-01-19 DIAGNOSIS — Z122 Encounter for screening for malignant neoplasm of respiratory organs: Secondary | ICD-10-CM

## 2024-02-03 ENCOUNTER — Ambulatory Visit (HOSPITAL_BASED_OUTPATIENT_CLINIC_OR_DEPARTMENT_OTHER)
Admission: RE | Admit: 2024-02-03 | Discharge: 2024-02-03 | Disposition: A | Discharge status: Home / Self Care | Source: Ambulatory Visit | Attending: Physician Assistant | Admitting: Physician Assistant

## 2024-02-03 DIAGNOSIS — Z122 Encounter for screening for malignant neoplasm of respiratory organs: Secondary | ICD-10-CM | POA: Diagnosis present

## 2024-02-03 DIAGNOSIS — Z87891 Personal history of nicotine dependence: Secondary | ICD-10-CM | POA: Insufficient documentation

## 2024-02-03 DIAGNOSIS — F17210 Nicotine dependence, cigarettes, uncomplicated: Secondary | ICD-10-CM | POA: Diagnosis present

## 2024-02-11 ENCOUNTER — Telehealth: Payer: Self-pay | Admitting: Acute Care

## 2024-02-11 DIAGNOSIS — R911 Solitary pulmonary nodule: Secondary | ICD-10-CM

## 2024-02-11 DIAGNOSIS — Z87891 Personal history of nicotine dependence: Secondary | ICD-10-CM

## 2024-02-11 NOTE — Telephone Encounter (Signed)
 Called patient to review recent Lung CT results. Pt was not home per spouse. Advises she will be home later today and will have her call the office tomorrow.

## 2024-02-11 NOTE — Telephone Encounter (Signed)
 This is fine for a 12 month follow up.  Please ask if she has been sick. Imaging looks like infectious bronchiolitis. If she has been sick, she should consider seeing PCP for treatment.  If she has not been sick, let me know and we will consider a 6 mont follow up instead. She does have notation of an adrenal adenoma. Usually benign.and usually do not cause symptoms.  Symptoms include excess hormones that cause elevated BP, and potassium, weight gain, muscle weakness, fatigue, mood changes, Cushing's Disease, increased facial hair, bruising and abdominal fullness/ pain. If she is experiencing any of these, please have her follow up with her PCP.  12 month follow up, please fax results to PCP. Thanks so much

## 2024-02-12 NOTE — Telephone Encounter (Signed)
 Patient returned call. Spoke with pt. We discussed the results of her LDCT. Patient states she has not had any acute chest symptoms at the time of her LDCT. She states she has occasional wheezing but this is not new for her because of her COPD, she uses her albuterol hfa with relief. Patient states she only has runny nose and occasional PND. Patient states she no longer takes singulair but does take benadryl once a day. Advised pt she may be better suited taking an OTC 24hr antihistamine like Zyrtec or Claritin. Patient states she will try this and call back for an OV with Dr. Neysa if she needs.    Will send back to [place] to advise if she wants a 6 month vs 12 month LDCT. Thanks!

## 2024-02-16 NOTE — Telephone Encounter (Signed)
 Spoke with patient and advised that Sarah Groce, NP recommends to repeat CT in 6 months. Patient verbalized understanding and is aware we will contact her closer to that time to schedule.

## 2024-05-27 ENCOUNTER — Telehealth (HOSPITAL_COMMUNITY): Payer: Self-pay

## 2024-05-27 NOTE — Telephone Encounter (Signed)
 Outside/paper referral received by Dr. Adnan Javaid from [facility]. Insurance benefits and eligibility to be determined.   Attempted to call patient to confirm interest in pulmonary rehab- no answer, left message. Sent MyChart message.

## 2024-06-01 ENCOUNTER — Telehealth (HOSPITAL_COMMUNITY): Payer: Self-pay

## 2024-06-01 NOTE — Telephone Encounter (Signed)
 Pt insurance is active and benefits verified through [employer]. Co-pay $0, DED $257/unknown met, out of pocket $0/$0 met, co-insurance 20%. No pre-authorization required. 72 lifetime visits.  2ndary insurance is active and benefits verified through [employer] [place]. Co-pay $0, DED $0/$0 met, out of pocket $0/$0 met, co-insurance 0%. No pre-authorization required.

## 2024-06-02 ENCOUNTER — Telehealth (HOSPITAL_COMMUNITY): Payer: Self-pay

## 2024-06-02 NOTE — Telephone Encounter (Signed)
 LVM for [facility]. Asked them to fax pt's PFTs.

## 2024-06-07 ENCOUNTER — Other Ambulatory Visit (HOSPITAL_COMMUNITY): Payer: Self-pay

## 2024-06-07 ENCOUNTER — Telehealth (HOSPITAL_COMMUNITY): Payer: Self-pay

## 2024-06-07 NOTE — Telephone Encounter (Signed)
 Called patient to see if she was interested in participating in the Pulmonary Rehab Program. Patient will come in for orientation on 06/11/24 and will attend the 1:15 exercise class.  Pensions consultant.

## 2024-06-07 NOTE — Telephone Encounter (Signed)
 PR letter

## 2024-06-10 ENCOUNTER — Telehealth (HOSPITAL_COMMUNITY): Payer: Self-pay

## 2024-06-10 NOTE — Telephone Encounter (Signed)
 Patient called to reschedule PR orientation from 12/05 to 12/12 @ 1:00pm due to expected weather. Updated schedule.

## 2024-06-11 ENCOUNTER — Encounter (HOSPITAL_COMMUNITY)

## 2024-06-17 ENCOUNTER — Encounter (HOSPITAL_COMMUNITY)

## 2024-06-17 ENCOUNTER — Telehealth (HOSPITAL_COMMUNITY): Payer: Self-pay

## 2024-06-17 NOTE — Telephone Encounter (Signed)
 Called to confirm appt. Pt confirmed appt. Instructed pt on proper footwear. Gave directions along with department number.

## 2024-06-18 ENCOUNTER — Encounter (HOSPITAL_COMMUNITY)
Admission: RE | Admit: 2024-06-18 | Discharge: 2024-06-18 | Disposition: A | Discharge status: Home / Self Care | Source: Ambulatory Visit | Attending: Internal Medicine | Admitting: Internal Medicine

## 2024-06-18 ENCOUNTER — Encounter (HOSPITAL_COMMUNITY): Payer: Self-pay

## 2024-06-18 VITALS — BP 114/62 | HR 72 | Ht 62.0 in | Wt 139.6 lb

## 2024-06-18 DIAGNOSIS — J449 Chronic obstructive pulmonary disease, unspecified: Secondary | ICD-10-CM | POA: Diagnosis present

## 2024-06-18 NOTE — Progress Notes (Signed)
 Sherry Stone 68 y.o. female Pulmonary Rehab Orientation Note This patient who was referred to Pulmonary Rehab by Dr. Javaid with the diagnosis of COPD 3 arrived today in Cardiac and Pulmonary Rehab. She  arrived ambulatory with normal gait. She  does not carry portable oxygen. Per patient, Sherry Stone uses oxygen never. Color good, skin warm and dry. Patient is oriented to time and place. Patient's medical history, psychosocial health, and medications reviewed. Psychosocial assessment reveals patient lives with spouse and her son. Sherry Stone is currently retired. Patient hobbies include watching tv, spending time with others, and participating in church activities. Patient reports her stress level is moderate. Areas of stress/anxiety include health and finances. Patient does not exhibit signs of depression. Signs of depression include hopelessness and fatigue. PHQ2/9 score 2/4. Sherry Stone shows good  coping skills with positive outlook on life. Offered emotional support and reassurance. Will continue to monitor and evaluate progress toward psychosocial goal(s) of decreased stress. Physical assessment reveals heart rate is normal, breath sounds clear to auscultation, no wheezes, rales, or rhonchi. Grip strength equal, strong. Distal pulses present. Sherry Stone reports she does take medications as prescribed. Patient states she follows a regular  diet. The patient reports no specific efforts to gain or lose weight. Sherry Stone stated she recently quit smoking and does not want to gain weight. Pt's weight will be monitored closely. Demonstration and practice of PLB using pulse oximeter. Sherry Stone able to return demonstration satisfactorily. Safety and hand hygiene in the exercise area reviewed with patient. Sherry Stone voices understanding of the information reviewed. Department expectations discussed with patient and achievable goals were set. The patient shows enthusiasm about attending the program and we look forward to working with Sherry. Sherry Stone  completed a 6 min walk test today and is scheduled to begin exercise on 06/24/24 at 1:15pm.   [id number] Sherry Stone

## 2024-06-18 NOTE — Progress Notes (Signed)
 Sherry Stone 68 y.o. female  Initial Psychosocial Assessment  Pt psychosocial assessment reveals pt lives with her spouse and son. Pt is currently retired. Pt hobbies include watching tv, spending time with others, and participating in church activities. Pt reports her stress level is moderate. Areas of stress/anxiety include health and finances.  Pt does not exhibit signs of depression. Signs of depression include hopelessness and fatigue. Pt shows good  coping skills with positive outlook . Offered emotional support and reassurance. Monitor and evaluate progress toward psychosocial goal(s).  Goal(s): Improved management of stress Improved coping skills Help patient work toward returning to meaningful activities that improve patient's QOL and are attainable with patient's lung disease   06/18/2024 3:29 PM

## 2024-06-18 NOTE — Progress Notes (Signed)
 Pulmonary Individual Treatment Plan  Patient Details  Name: Sherry Stone MRN: [id number] Date of Birth: 06-10-1956 Referring Provider:   Conrad Ports Pulmonary Rehab Walk Test from 06/18/2024 in [facility]  Referring Provider Javaid    Initial Encounter Date:  Flowsheet Row Pulmonary Rehab Walk Test from 06/18/2024 in [facility]  Date 06/18/24    Visit Diagnosis: Stage 3 severe COPD by GOLD classification (HCC)  Patient's Home Medications on Admission:  Current Medications[1]  Past Medical History: Past Medical History:  Diagnosis Date   Breast cancer (HCC)    C2 cervical fracture (HCC)    Degenerative disc disease    Depression    Endometriosis    History of radiation therapy    Hyperlipidemia    Hypertension    Pernicious anemia    Substance abuse (HCC)     Tobacco Use: Tobacco Use History[2]  Labs: Review Flowsheet        No data to display          Capillary Blood Glucose: Lab Results  Component Value Date   GLUCAP 116 (H) 01/23/2023     Pulmonary Assessment Scores:  Pulmonary Assessment Scores     Row Name 06/18/24 1510         ADL UCSD   ADL Phase Entry     SOB Score total 29       CAT Score   CAT Score 9       mMRC Score   mMRC Score 3       UCSD: Self-administered rating of dyspnea associated with activities of daily living (ADLs) 6-point scale (0 = not at all to 5 = maximal or unable to do because of breathlessness)  Scoring Scores range from 0 to 120.  Minimally important difference is 5 units  CAT: CAT can identify the health impairment of COPD patients and is better correlated with disease progression.  CAT has a scoring range of zero to 40. The CAT score is classified into four groups of low (less than 10), medium (10 - 20), high (21-30) and very high (31-40) based on the impact level of disease on  health status. A CAT score over 10 suggests significant symptoms.  A worsening CAT score could be explained by an exacerbation, poor medication adherence, poor inhaler technique, or progression of COPD or comorbid conditions.  CAT MCID is 2 points  mMRC: mMRC (Modified Medical Research Council) Dyspnea Scale is used to assess the degree of baseline functional disability in patients of respiratory disease due to dyspnea. No minimal important difference is established. A decrease in score of 1 point or greater is considered a positive change.   Pulmonary Function Assessment:  Pulmonary Function Assessment - 06/18/24 1351       Breath   Bilateral Breath Sounds Decreased    Shortness of Breath Yes;Limiting activity          Exercise Target Goals: Exercise Program Goal: Individual exercise prescription set using results from initial 6 min walk test and THRR while considering  patients activity barriers and safety.   Exercise Prescription Goal: Initial exercise prescription builds to 30-45 minutes a day of aerobic activity, 2-3 days per week.  Home exercise guidelines will be given to patient during program as part of exercise prescription that the participant will acknowledge.  Activity Barriers & Risk Stratification:  Activity Barriers & Cardiac Risk Stratification -  06/18/24 1509       Activity Barriers & Cardiac Risk Stratification   Activity Barriers Deconditioning;Muscular Weakness;Shortness of Breath;History of Falls;Balance Concerns          6 Minute Walk:  6 Minute Walk     Row Name 06/18/24 1520         6 Minute Walk   Phase Initial     Distance 840 feet     Walk Time 6 minutes     # of Rest Breaks 0     MPH 1.59     METS 1.93     RPE 11     Perceived Dyspnea  3     VO2 Peak 6.77     Symptoms No     Resting HR 72 bpm     Resting BP 114/62     Resting Oxygen Saturation  94 %     Exercise Oxygen Saturation  during 6 min walk 92 %     Max Ex. HR 89 bpm      Max Ex. BP 122/68     2 Minute Post BP 110/66       Interval HR   1 Minute HR 79     2 Minute HR 83     3 Minute HR 83     4 Minute HR 84     5 Minute HR 84     6 Minute HR 89     2 Minute Post HR 76     Interval Heart Rate? Yes       Interval Oxygen   Interval Oxygen? Yes     Baseline Oxygen Saturation % 94 %     1 Minute Oxygen Saturation % 94 %     1 Minute Liters of Oxygen 0 L     2 Minute Oxygen Saturation % 93 %     2 Minute Liters of Oxygen 0 L     3 Minute Oxygen Saturation % 94 %     3 Minute Liters of Oxygen 0 L     4 Minute Oxygen Saturation % 92 %     4 Minute Liters of Oxygen 0 L     5 Minute Oxygen Saturation % 94 %     5 Minute Liters of Oxygen 0 L     6 Minute Oxygen Saturation % 97 %     6 Minute Liters of Oxygen 0 L     2 Minute Post Oxygen Saturation % 98 %     2 Minute Post Liters of Oxygen 0 L        Oxygen Initial Assessment:  Oxygen Initial Assessment - 06/18/24 1351       Home Oxygen   Home Oxygen Device None    Sleep Oxygen Prescription None    Home Exercise Oxygen Prescription None    Home Resting Oxygen Prescription None      Initial 6 min Walk   Oxygen Used None      Program Oxygen Prescription   Program Oxygen Prescription None      Intervention   Short Term Goals To learn and demonstrate proper use of respiratory medications;To learn and understand importance of maintaining oxygen saturations>88%;To learn and exhibit compliance with exercise, home and travel O2 prescription;To learn and understand importance of monitoring SPO2 with pulse oximeter and demonstrate accurate use of the pulse oximeter.;To learn and demonstrate proper pursed lip breathing techniques or other breathing techniques.     Long  Term Goals Verbalizes importance of monitoring SPO2 with pulse oximeter and return demonstration;Exhibits proper breathing techniques, such as pursed lip breathing or other method taught during program session;Demonstrates proper use of  MDIs;Compliance with respiratory medication;Maintenance of O2 saturations>88%;Exhibits compliance with exercise, home  and travel O2 prescription          Oxygen Re-Evaluation:   Oxygen Discharge (Final Oxygen Re-Evaluation):   Initial Exercise Prescription:  Initial Exercise Prescription - 06/18/24 1400       Date of Initial Exercise RX and Referring Provider   Date 06/18/24    Referring Provider Javaid    Expected Discharge Date 09/21/24      Recumbant Elliptical   Level 1    RPM 35    Watts 15    Minutes 15    METs 2.1      Track   Minutes 15    METs 2      Prescription Details   Frequency (times per week) 2    Duration Progress to 30 minutes of continuous aerobic without signs/symptoms of physical distress      Intensity   THRR 40-80% of Max Heartrate 60-120    Ratings of Perceived Exertion 11-13    Perceived Dyspnea 0-4      Progression   Progression Continue progressive overload as per policy without signs/symptoms or physical distress.      Resistance Training   Training Prescription Yes    Weight red bands    Reps 10-15          Perform Capillary Blood Glucose checks as needed.  Exercise Prescription Changes:   Exercise Comments:   Exercise Goals and Review:   Exercise Goals Re-Evaluation :   Discharge Exercise Prescription (Final Exercise Prescription Changes):   Nutrition:  Target Goals: Understanding of nutrition guidelines, daily intake of sodium 1500mg , cholesterol 200mg , calories 30% from fat and 7% or less from saturated fats, daily to have 5 or more servings of fruits and vegetables.  Biometrics:  Pre Biometrics - 06/18/24 1509       Pre Biometrics   Grip Strength 10 kg           Nutrition Therapy Plan and Nutrition Goals:   Nutrition Assessments:  MEDIFICTS Score Key: >=70 Need to make dietary changes  40-70 Heart Healthy Diet <= 40 Therapeutic Level Cholesterol Diet   Picture Your Plate Scores: <59  Unhealthy dietary pattern with much room for improvement. 41-50 Dietary pattern unlikely to meet recommendations for good health and room for improvement. 51-60 More healthful dietary pattern, with some room for improvement.  >60 Healthy dietary pattern, although there may be some specific behaviors that could be improved.    Nutrition Goals Re-Evaluation:   Nutrition Goals Discharge (Final Nutrition Goals Re-Evaluation):   Psychosocial: Target Goals: Acknowledge presence or absence of significant depression and/or stress, maximize coping skills, provide positive support system. Participant is able to verbalize types and ability to use techniques and skills needed for reducing stress and depression.  Initial Review & Psychosocial Screening:  Initial Psych Review & Screening - 06/18/24 1402       Initial Review   Current issues with Current Stress Concerns    Source of Stress Concerns Financial;Chronic Illness    Comments Pt states she has some financial and health related stress. She states she sees her therapist monthly and is medication compliant. She states she has good support from her husband and son. Bertie stated her son is a failure to launch, lives  at home with her and her husband. She reports her faith has helped her through sobriety and she recently quit smoking on 05/25/2024. Almee denies any additional psy/soc education, resources or referrals at this time.      Family Dynamics   Good Support System? Yes    Comments Good support with husband and son      Barriers   Psychosocial barriers to participate in program The patient should benefit from training in stress management and relaxation.      Screening Interventions   Interventions Encouraged to exercise;Provide feedback about the scores to participant    Expected Outcomes Short Term goal: Utilizing psychosocial counselor, staff and physician to assist with identification of specific Stressors or current issues  interfering with healing process. Setting desired goal for each stressor or current issue identified.;Long Term Goal: Stressors or current issues are controlled or eliminated.;Short Term goal: Identification and review with participant of any Quality of Life or Depression concerns found by scoring the questionnaire.;Long Term goal: The participant improves quality of Life and PHQ9 Scores as seen by post scores and/or verbalization of changes          Quality of Life Scores:  Scores of 19 and below usually indicate a poorer quality of life in these areas.  A difference of  2-3 points is a clinically meaningful difference.  A difference of 2-3 points in the total score of the Quality of Life Index has been associated with significant improvement in overall quality of life, self-image, physical symptoms, and general health in studies assessing change in quality of life.  PHQ-9: Review Flowsheet       06/18/2024  Depression screen PHQ 2/9  Decreased Interest 1  Down, Depressed, Hopeless 1  PHQ - 2 Score 2  Altered sleeping 1  Tired, decreased energy 1  Change in appetite 0  Feeling bad or failure about yourself  0  Trouble concentrating 0  Moving slowly or fidgety/restless 0  Suicidal thoughts 0  PHQ-9 Score 4  Difficult doing work/chores Not difficult at all   Interpretation of Total Score  Total Score Depression Severity:  1-4 = Minimal depression, 5-9 = Mild depression, 10-14 = Moderate depression, 15-19 = Moderately severe depression, 20-27 = Severe depression   Psychosocial Evaluation and Intervention:  Psychosocial Evaluation - 06/18/24 1407       Psychosocial Evaluation & Interventions   Interventions Relaxation education;Encouraged to exercise with the program and follow exercise prescription    Comments Pt states she has some financial and health related stress. She states she sees her therapist monthly and is medication compliant. She states she has good support from her  husband and son. Crystol stated her son is a failure to launch, lives at home with her and her husband. She reports her faith has helped her through sobriety and she recently quit smoking on 05/25/2024. Charlesa denies any additional psy/soc education, resources or referrals at this time.    Expected Outcomes For Tonilynn to use positive coping mechanisms to deal with her stress. To attend Pulm Rehab and exercise without any additional psy/soc barriers or concerns.    Continue Psychosocial Services  Follow up required by staff          Psychosocial Re-Evaluation:   Psychosocial Discharge (Final Psychosocial Re-Evaluation):   Education: Education Goals: Education classes will be provided on a weekly basis, covering required topics. Participant will state understanding/return demonstration of topics presented.  Learning Barriers/Preferences:  Learning Barriers/Preferences - 06/18/24 1351  Learning Barriers/Preferences   Learning Barriers None    Learning Preferences None          Education Topics: Know Your Numbers Group instruction that is supported by a PowerPoint presentation. Instructor discusses importance of knowing and understanding resting, exercise, and post-exercise oxygen saturation, heart rate, and blood pressure. Oxygen saturation, heart rate, blood pressure, rating of perceived exertion, and dyspnea are reviewed along with a normal range for these values.    Exercise for the Pulmonary Patient Group instruction that is supported by a PowerPoint presentation. Instructor discusses benefits of exercise, core components of exercise, frequency, duration, and intensity of an exercise routine, importance of utilizing pulse oximetry during exercise, safety while exercising, and options of places to exercise outside of rehab.    MET Level  Group instruction provided by PowerPoint, verbal discussion, and written material to support subject matter. Instructor reviews what METs  are and how to increase METs.    Pulmonary Medications Verbally interactive group education provided by instructor with focus on inhaled medications and proper administration.   Anatomy and Physiology of the Respiratory System Group instruction provided by PowerPoint, verbal discussion, and written material to support subject matter. Instructor reviews respiratory cycle and anatomical components of the respiratory system and their functions. Instructor also reviews differences in obstructive and restrictive respiratory diseases with examples of each.    Oxygen Safety Group instruction provided by PowerPoint, verbal discussion, and written material to support subject matter. There is an overview of What is Oxygen and Why do we need it.  Instructor also reviews how to create a safe environment for oxygen use, the importance of using oxygen as prescribed, and the risks of noncompliance. There is a brief discussion on traveling with oxygen and resources the patient may utilize.   Oxygen Use Group instruction provided by PowerPoint, verbal discussion, and written material to discuss how supplemental oxygen is prescribed and different types of oxygen supply systems. Resources for more information are provided.    Breathing Techniques Group instruction that is supported by demonstration and informational handouts. Instructor discusses the benefits of pursed lip and diaphragmatic breathing and detailed demonstration on how to perform both.     Risk Factor Reduction Group instruction that is supported by a PowerPoint presentation. Instructor discusses the definition of a risk factor, different risk factors for pulmonary disease, and how the heart and lungs work together.   Pulmonary Diseases Group instruction provided by PowerPoint, verbal discussion, and written material to support subject matter. Instructor gives an overview of the different type of pulmonary diseases. There is also a  discussion on risk factors and symptoms as well as ways to manage the diseases.   Stress and Energy Conservation Group instruction provided by PowerPoint, verbal discussion, and written material to support subject matter. Instructor gives an overview of stress and the impact it can have on the body. Instructor also reviews ways to reduce stress. There is also a discussion on energy conservation and ways to conserve energy throughout the day.   Warning Signs and Symptoms Group instruction provided by PowerPoint, verbal discussion, and written material to support subject matter. Instructor reviews warning signs and symptoms of stroke, heart attack, cold and flu. Instructor also reviews ways to prevent the spread of infection.   Other Education Group or individual verbal, written, or video instructions that support the educational goals of the pulmonary rehab program.    Knowledge Questionnaire Score:  Knowledge Questionnaire Score - 06/18/24 1351       Knowledge  Questionnaire Score   Pre Score 17/18          Core Components/Risk Factors/Patient Goals at Admission:  Personal Goals and Risk Factors at Admission - 06/18/24 1351       Core Components/Risk Factors/Patient Goals on Admission    Weight Management Weight Maintenance;Yes   Recently stopped smoking, doesn't want to gain weight, usually weight between 128-134#   Intervention Weight Management: Develop a combined nutrition and exercise program designed to reach desired caloric intake, while maintaining appropriate intake of nutrient and fiber, sodium and fats, and appropriate energy expenditure required for the weight goal.;Weight Management: Provide education and appropriate resources to help participant work on and attain dietary goals.    Admit Weight 140 lb 3.4 oz (63.6 kg)    Expected Outcomes Short Term: Continue to assess and modify interventions until short term weight is achieved;Long Term: Adherence to nutrition and  physical activity/exercise program aimed toward attainment of established weight goal;Weight Maintenance: Understanding of the daily nutrition guidelines, which includes 25-35% calories from fat, 7% or less cal from saturated fats, less than 200mg  cholesterol, less than 1.5gm of sodium, & 5 or more servings of fruits and vegetables daily;Understanding recommendations for meals to include 15-35% energy as protein, 25-35% energy from fat, 35-60% energy from carbohydrates, less than 200mg  of dietary cholesterol, 20-35 gm of total fiber daily;Understanding of distribution of calorie intake throughout the day with the consumption of 4-5 meals/snacks    Improve shortness of breath with ADL's Yes    Intervention Provide education, individualized exercise plan and daily activity instruction to help decrease symptoms of SOB with activities of daily living.    Expected Outcomes Short Term: Improve cardiorespiratory fitness to achieve a reduction of symptoms when performing ADLs;Long Term: Be able to perform more ADLs without symptoms or delay the onset of symptoms          Core Components/Risk Factors/Patient Goals Review:    Core Components/Risk Factors/Patient Goals at Discharge (Final Review):    ITP Comments:   Comments: Dr. Slater Staff is Medical Director for Pulmonary Rehab at [facility].       [1]  Current Outpatient Medications:    albuterol (VENTOLIN HFA) 108 (90 Base) MCG/ACT inhaler, Inhale 2 puffs into the lungs., Disp: , Rfl:    amLODipine (NORVASC) 10 MG tablet, Take 1 tablet by mouth daily., Disp: , Rfl:    bisoprolol (ZEBETA) 10 MG tablet, Take 10 mg by mouth. , Disp: , Rfl:    buPROPion (WELLBUTRIN XL) 300 MG 24 hr tablet, Take 300 mg by mouth., Disp: , Rfl:    Cholecalciferol (VITAMIN D ) 2000 UNITS tablet, Take 2,000 Units by mouth daily., Disp: , Rfl:    fluticasone-salmeterol (ADVAIR HFA) 115-21 MCG/ACT inhaler, Inhale 2 puffs into the lungs 2 (two) times daily.,  Disp: , Rfl:    losartan (COZAAR) 100 MG tablet, Take 100 mg by mouth daily., Disp: , Rfl:    PARoxetine (PAXIL-CR) 25 MG 24 hr tablet, Take 25 mg by mouth daily., Disp: , Rfl:    simvastatin (ZOCOR) 40 MG tablet, Take 40 mg by mouth every evening., Disp: , Rfl:    vitamin B-12 (CYANOCOBALAMIN) 1000 MCG tablet, Take 2,000 mcg by mouth daily., Disp: , Rfl:    montelukast (SINGULAIR) 10 MG tablet, Take 1 tablet by mouth at bedtime., Disp: , Rfl:  [2]  Social History Tobacco Use  Smoking Status Former   Current packs/day: 0.00   Average packs/day: 0.5 packs/day for 44.3  years (22.2 ttl pk-yrs)   Types: Cigarettes   Start date: 01/30/1980   Quit date: 05/25/2024   Years since quitting: 0.0   Passive exposure: Current  Smokeless Tobacco Never  Tobacco Comments   Quit smoking 05/25/24

## 2024-06-22 ENCOUNTER — Encounter (HOSPITAL_COMMUNITY)

## 2024-06-24 ENCOUNTER — Encounter (HOSPITAL_COMMUNITY): Admission: RE | Admit: 2024-06-24 | Discharge: 2024-06-24 | Attending: Internal Medicine | Admitting: Internal Medicine

## 2024-06-24 VITALS — Wt 139.1 lb

## 2024-06-24 DIAGNOSIS — J449 Chronic obstructive pulmonary disease, unspecified: Secondary | ICD-10-CM

## 2024-06-24 NOTE — Progress Notes (Signed)
 Daily Session Note  Patient Details  Name: Sherry Stone MRN: [id number] Date of Birth: Aug 15, 1955 Referring Provider:   [facility]rad [facility]s [facility] Walk Test from 06/18/2024 in [facility]  Referring Provider Sharleen    Encounter Date: 06/24/2024  Check In:  Session Check In - 06/24/24 1317       Check-In   Supervising physician immediately available to respond to emergencies [facility] MD immediately available    Physician(s) Barnie Hila, NP    Location [facility]    Staff Present Ronal Levin, RN, BSN;Randi Midge BS, ACSM-CEP, Exercise Physiologist;Kaylee Nicholaus, MS, ACSM-CEP, Exercise Physiologist;Casey Claudene, RT    Virtual Visit No    Medication changes reported     No    Fall or balance concerns reported    Yes    Tobacco Cessation Use Decreased   Quit smoking 05/25/24   Current number of cigarettes/nicotine per day     0    Warm-up and Cool-down Performed as group-led instruction    Resistance Training Performed No    VAD Patient? No    PAD/SET Patient? No      Pain Assessment   Currently in Pain? No/denies    Multiple Pain Sites No          Capillary Blood Glucose: No results found for this or any previous visit (from the past 24 hours).    Tobacco Use History[1]  Goals Met:  Exercise tolerated well Queuing for purse lip breathing No report of concerns or symptoms today Strength training completed today  Goals Unmet:  Not Applicable  Comments: Service time is from 1311 to 1449    Dr. Slater Staff is Medical Director for Pulmonary Rehab at [facility].     [1]  Social History Tobacco Use  Smoking Status Former   Current packs/day: 0.00   Average packs/day: 0.5 packs/day for 44.3 years (22.2 ttl pk-yrs)   Types: Cigarettes   Start date: 01/30/1980   Quit date: 05/25/2024   Years since quitting: 0.0   Passive exposure: Current  Smokeless Tobacco Never   Tobacco Comments   Quit smoking 05/25/24

## 2024-06-29 ENCOUNTER — Encounter (HOSPITAL_COMMUNITY): Admission: RE | Admit: 2024-06-29 | Source: Ambulatory Visit | Attending: Anesthesiology | Admitting: Anesthesiology

## 2024-06-29 ENCOUNTER — Telehealth (HOSPITAL_COMMUNITY): Payer: Self-pay

## 2024-06-29 NOTE — Telephone Encounter (Signed)
 Patient c/o sick for 1:15 PR class.

## 2024-06-30 NOTE — Progress Notes (Signed)
 Pulmonary Individual Treatment Plan  Patient Details  Name: Sherry Stone MRN: [id number] Date of Birth: 1955/11/25 Referring Provider:   Conrad Ports Pulmonary Rehab Walk Test from 06/18/2024 in [facility]  Referring Provider Javaid    Initial Encounter Date:  Flowsheet Row Pulmonary Rehab Walk Test from 06/18/2024 in [facility]  Date 06/18/24    Visit Diagnosis: Stage 3 severe COPD by GOLD classification (HCC)  Patient's Home Medications on Admission:  Current Medications[1]  Past Medical History: Past Medical History:  Diagnosis Date   Breast cancer (HCC)    C2 cervical fracture (HCC)    Degenerative disc disease    Depression    Endometriosis    History of radiation therapy    Hyperlipidemia    Hypertension    Pernicious anemia    Substance abuse (HCC)     Tobacco Use: Tobacco Use History[2]  Labs: Review Flowsheet        No data to display          Capillary Blood Glucose: Lab Results  Component Value Date   GLUCAP 116 (H) 01/23/2023     Pulmonary Assessment Scores:  Pulmonary Assessment Scores     Row Name 06/18/24 1510         ADL UCSD   ADL Phase Entry     SOB Score total 29       CAT Score   CAT Score 9       mMRC Score   mMRC Score 3       UCSD: Self-administered rating of dyspnea associated with activities of daily living (ADLs) 6-point scale (0 = not at all to 5 = maximal or unable to do because of breathlessness)  Scoring Scores range from 0 to 120.  Minimally important difference is 5 units  CAT: CAT can identify the health impairment of COPD patients and is better correlated with disease progression.  CAT has a scoring range of zero to 40. The CAT score is classified into four groups of low (less than 10), medium (10 - 20), high (21-30) and very high (31-40) based on the impact level of disease on  health status. A CAT score over 10 suggests significant symptoms.  A worsening CAT score could be explained by an exacerbation, poor medication adherence, poor inhaler technique, or progression of COPD or comorbid conditions.  CAT MCID is 2 points  mMRC: mMRC (Modified Medical Research Council) Dyspnea Scale is used to assess the degree of baseline functional disability in patients of respiratory disease due to dyspnea. No minimal important difference is established. A decrease in score of 1 point or greater is considered a positive change.   Pulmonary Function Assessment:  Pulmonary Function Assessment - 06/18/24 1351       Breath   Bilateral Breath Sounds Decreased    Shortness of Breath Yes;Limiting activity          Exercise Target Goals: Exercise Program Goal: Individual exercise prescription set using results from initial 6 min walk test and THRR while considering  patients activity barriers and safety.   Exercise Prescription Goal: Initial exercise prescription builds to 30-45 minutes a day of aerobic activity, 2-3 days per week.  Home exercise guidelines will be given to patient during program as part of exercise prescription that the participant will acknowledge.  Activity Barriers & Risk Stratification:  Activity Barriers & Cardiac Risk Stratification -  06/18/24 1509       Activity Barriers & Cardiac Risk Stratification   Activity Barriers Deconditioning;Muscular Weakness;Shortness of Breath;History of Falls;Balance Concerns          6 Minute Walk:  6 Minute Walk     Row Name 06/18/24 1520         6 Minute Walk   Phase Initial     Distance 840 feet     Walk Time 6 minutes     # of Rest Breaks 0     MPH 1.59     METS 1.93     RPE 11     Perceived Dyspnea  3     VO2 Peak 6.77     Symptoms No     Resting HR 72 bpm     Resting BP 114/62     Resting Oxygen Saturation  94 %     Exercise Oxygen Saturation  during 6 min walk 92 %     Max Ex. HR 89 bpm      Max Ex. BP 122/68     2 Minute Post BP 110/66       Interval HR   1 Minute HR 79     2 Minute HR 83     3 Minute HR 83     4 Minute HR 84     5 Minute HR 84     6 Minute HR 89     2 Minute Post HR 76     Interval Heart Rate? Yes       Interval Oxygen   Interval Oxygen? Yes     Baseline Oxygen Saturation % 94 %     1 Minute Oxygen Saturation % 94 %     1 Minute Liters of Oxygen 0 L     2 Minute Oxygen Saturation % 93 %     2 Minute Liters of Oxygen 0 L     3 Minute Oxygen Saturation % 94 %     3 Minute Liters of Oxygen 0 L     4 Minute Oxygen Saturation % 92 %     4 Minute Liters of Oxygen 0 L     5 Minute Oxygen Saturation % 94 %     5 Minute Liters of Oxygen 0 L     6 Minute Oxygen Saturation % 97 %     6 Minute Liters of Oxygen 0 L     2 Minute Post Oxygen Saturation % 98 %     2 Minute Post Liters of Oxygen 0 L        Oxygen Initial Assessment:  Oxygen Initial Assessment - 06/18/24 1351       Home Oxygen   Home Oxygen Device None    Sleep Oxygen Prescription None    Home Exercise Oxygen Prescription None    Home Resting Oxygen Prescription None      Initial 6 min Walk   Oxygen Used None      Program Oxygen Prescription   Program Oxygen Prescription None      Intervention   Short Term Goals To learn and demonstrate proper use of respiratory medications;To learn and understand importance of maintaining oxygen saturations>88%;To learn and exhibit compliance with exercise, home and travel O2 prescription;To learn and understand importance of monitoring SPO2 with pulse oximeter and demonstrate accurate use of the pulse oximeter.;To learn and demonstrate proper pursed lip breathing techniques or other breathing techniques.     Long  Term Goals Verbalizes importance of monitoring SPO2 with pulse oximeter and return demonstration;Exhibits proper breathing techniques, such as pursed lip breathing or other method taught during program session;Demonstrates proper use of  MDIs;Compliance with respiratory medication;Maintenance of O2 saturations>88%;Exhibits compliance with exercise, home  and travel O2 prescription          Oxygen Re-Evaluation:  Oxygen Re-Evaluation     Row Name 06/25/24 1415             Program Oxygen Prescription   Program Oxygen Prescription None         Home Oxygen   Home Oxygen Device None       Sleep Oxygen Prescription None       Home Exercise Oxygen Prescription None       Home Resting Oxygen Prescription None         Goals/Expected Outcomes   Short Term Goals To learn and demonstrate proper use of respiratory medications;To learn and understand importance of maintaining oxygen saturations>88%;To learn and exhibit compliance with exercise, home and travel O2 prescription;To learn and understand importance of monitoring SPO2 with pulse oximeter and demonstrate accurate use of the pulse oximeter.;To learn and demonstrate proper pursed lip breathing techniques or other breathing techniques.        Long  Term Goals Verbalizes importance of monitoring SPO2 with pulse oximeter and return demonstration;Exhibits proper breathing techniques, such as pursed lip breathing or other method taught during program session;Demonstrates proper use of MDIs;Compliance with respiratory medication;Maintenance of O2 saturations>88%;Exhibits compliance with exercise, home  and travel O2 prescription       Goals/Expected Outcomes Compliance and understanding of oxygen saturation monitoring and breath techniques to decrease shortness of breath.          Oxygen Discharge (Final Oxygen Re-Evaluation):  Oxygen Re-Evaluation - 06/25/24 1415       Program Oxygen Prescription   Program Oxygen Prescription None      Home Oxygen   Home Oxygen Device None    Sleep Oxygen Prescription None    Home Exercise Oxygen Prescription None    Home Resting Oxygen Prescription None      Goals/Expected Outcomes   Short Term Goals To learn and demonstrate  proper use of respiratory medications;To learn and understand importance of maintaining oxygen saturations>88%;To learn and exhibit compliance with exercise, home and travel O2 prescription;To learn and understand importance of monitoring SPO2 with pulse oximeter and demonstrate accurate use of the pulse oximeter.;To learn and demonstrate proper pursed lip breathing techniques or other breathing techniques.     Long  Term Goals Verbalizes importance of monitoring SPO2 with pulse oximeter and return demonstration;Exhibits proper breathing techniques, such as pursed lip breathing or other method taught during program session;Demonstrates proper use of MDIs;Compliance with respiratory medication;Maintenance of O2 saturations>88%;Exhibits compliance with exercise, home  and travel O2 prescription    Goals/Expected Outcomes Compliance and understanding of oxygen saturation monitoring and breath techniques to decrease shortness of breath.          Initial Exercise Prescription:  Initial Exercise Prescription - 06/18/24 1400       Date of Initial Exercise RX and Referring Provider   Date 06/18/24    Referring Provider Javaid    Expected Discharge Date 09/21/24      Recumbant Elliptical   Level 1    RPM 35    Watts 15    Minutes 15    METs 2.1      Track   Minutes 15  METs 2      Prescription Details   Frequency (times per week) 2    Duration Progress to 30 minutes of continuous aerobic without signs/symptoms of physical distress      Intensity   THRR 40-80% of Max Heartrate 60-120    Ratings of Perceived Exertion 11-13    Perceived Dyspnea 0-4      Progression   Progression Continue progressive overload as per policy without signs/symptoms or physical distress.      Resistance Training   Training Prescription Yes    Weight red bands    Reps 10-15          Perform Capillary Blood Glucose checks as needed.  Exercise Prescription Changes:   Exercise Prescription Changes      Row Name 06/24/24 1512             Response to Exercise   Blood Pressure (Admit) 108/70       Blood Pressure (Exercise) 112/72       Blood Pressure (Exit) 98/64       Heart Rate (Admit) 78 bpm       Heart Rate (Exercise) 85 bpm       Heart Rate (Exit) 81 bpm       Oxygen Saturation (Admit) 97 %       Oxygen Saturation (Exercise) 94 %       Oxygen Saturation (Exit) 96 %       Rating of Perceived Exertion (Exercise) 10       Perceived Dyspnea (Exercise) 1       Duration Continue with 30 min of aerobic exercise without signs/symptoms of physical distress.       Intensity THRR unchanged         Progression   Progression Continue to progress workloads to maintain intensity without signs/symptoms of physical distress.         Resistance Training   Weight red bands       Reps 10-15       Time 10 Minutes         Recumbant Elliptical   Level 1       RPM 35       Minutes 15       METs 2         Track   Laps 9       Minutes 15       METs 2.1          Exercise Comments:   Exercise Goals and Review:   Exercise Goals Re-Evaluation :  Exercise Goals Re-Evaluation     Row Name 06/25/24 1413             Exercise Goal Re-Evaluation   Exercise Goals Review Increase Physical Activity;Able to understand and use Dyspnea scale;Understanding of Exercise Prescription;Increase Strength and Stamina;Knowledge and understanding of Target Heart Rate Range (THRR);Able to understand and use rate of perceived exertion (RPE) scale       Comments Pt completed her first day of group exercise. She exercised on the recumbent elliptical for 15 min, level 1, 35 rpm, METs 2. She then walked the track for 15 min, 2.12 METs. She voices walking is difficult but willing to work on it. Will progress as tolerated. She performed warm up and cool down standing with red bands, 3.7 lbs.       Expected Outcomes Through exercise in rehab and at home, the patient will decrease shortness of breath with  daily activities and  feel confident in doing an exercise regimen at home.          Discharge Exercise Prescription (Final Exercise Prescription Changes):  Exercise Prescription Changes - 06/24/24 1512       Response to Exercise   Blood Pressure (Admit) 108/70    Blood Pressure (Exercise) 112/72    Blood Pressure (Exit) 98/64    Heart Rate (Admit) 78 bpm    Heart Rate (Exercise) 85 bpm    Heart Rate (Exit) 81 bpm    Oxygen Saturation (Admit) 97 %    Oxygen Saturation (Exercise) 94 %    Oxygen Saturation (Exit) 96 %    Rating of Perceived Exertion (Exercise) 10    Perceived Dyspnea (Exercise) 1    Duration Continue with 30 min of aerobic exercise without signs/symptoms of physical distress.    Intensity THRR unchanged      Progression   Progression Continue to progress workloads to maintain intensity without signs/symptoms of physical distress.      Resistance Training   Weight red bands    Reps 10-15    Time 10 Minutes      Recumbant Elliptical   Level 1    RPM 35    Minutes 15    METs 2      Track   Laps 9    Minutes 15    METs 2.1          Nutrition:  Target Goals: Understanding of nutrition guidelines, daily intake of sodium 1500mg , cholesterol 200mg , calories 30% from fat and 7% or less from saturated fats, daily to have 5 or more servings of fruits and vegetables.  Biometrics:  Pre Biometrics - 06/18/24 1509       Pre Biometrics   Grip Strength 10 kg           Nutrition Therapy Plan and Nutrition Goals:  Nutrition Therapy & Goals - 06/24/24 1433       Nutrition Therapy   Diet Regular diet    Drug/Food Interactions Statins/Certain Fruits      Personal Nutrition Goals   Nutrition Goal Patient to improve diet quality by using the plate method as a guide for meal planning to include lean protein/plant protein, fruits, vegetables, whole grains, nonfat dairy as part of a well-balanced diet.    Comments Pt with medical history significant for  COPD. Pt reports limited intake of vegetables due to husband's preferences; however plans to prepare desired vegetables such as cabbage for self. Also reports typically skipping breakfast in past for weight management. States smoking also suppressed appetite; reports quitting tobacco use ~ 1 month ago. Recently started eating earlier meal to help fuel body in preparation for rehab. RD discussed importance of adequate calories, protein to fuel body and maintain lean body mass. Patient will benefit from participation in pulmonary rehab for nutrition education, exercise, and lifestyle modification.      Intervention Plan   Intervention Prescribe, educate and counsel regarding individualized specific dietary modifications aiming towards targeted core components such as weight, hypertension, lipid management, diabetes, heart failure and other comorbidities.    Expected Outcomes Short Term Goal: Understand basic principles of dietary content, such as calories, fat, sodium, cholesterol and nutrients.;Long Term Goal: Adherence to prescribed nutrition plan.          Nutrition Assessments:  MEDIFICTS Score Key: >=70 Need to make dietary changes  40-70 Heart Healthy Diet <= 40 Therapeutic Level Cholesterol Diet   Picture Your Plate Scores: <59 Unhealthy dietary pattern  with much room for improvement. 41-50 Dietary pattern unlikely to meet recommendations for good health and room for improvement. 51-60 More healthful dietary pattern, with some room for improvement.  >60 Healthy dietary pattern, although there may be some specific behaviors that could be improved.    Nutrition Goals Re-Evaluation:   Nutrition Goals Discharge (Final Nutrition Goals Re-Evaluation):   Psychosocial: Target Goals: Acknowledge presence or absence of significant depression and/or stress, maximize coping skills, provide positive support system. Participant is able to verbalize types and ability to use techniques and  skills needed for reducing stress and depression.  Initial Review & Psychosocial Screening:  Initial Psych Review & Screening - 06/18/24 1402       Initial Review   Current issues with Current Stress Concerns    Source of Stress Concerns Financial;Chronic Illness    Comments Pt states she has some financial and health related stress. She states she sees her therapist monthly and is medication compliant. She states she has good support from her husband and son. Yanique stated her son is a failure to launch, lives at home with her and her husband. She reports her faith has helped her through sobriety and she recently quit smoking on 05/25/2024. Shireen denies any additional psy/soc education, resources or referrals at this time.      Family Dynamics   Good Support System? Yes    Comments Good support with husband and son      Barriers   Psychosocial barriers to participate in program The patient should benefit from training in stress management and relaxation.      Screening Interventions   Interventions Encouraged to exercise;Provide feedback about the scores to participant    Expected Outcomes Short Term goal: Utilizing psychosocial counselor, staff and physician to assist with identification of specific Stressors or current issues interfering with healing process. Setting desired goal for each stressor or current issue identified.;Long Term Goal: Stressors or current issues are controlled or eliminated.;Short Term goal: Identification and review with participant of any Quality of Life or Depression concerns found by scoring the questionnaire.;Long Term goal: The participant improves quality of Life and PHQ9 Scores as seen by post scores and/or verbalization of changes          Quality of Life Scores:  Scores of 19 and below usually indicate a poorer quality of life in these areas.  A difference of  2-3 points is a clinically meaningful difference.  A difference of 2-3 points in the total  score of the Quality of Life Index has been associated with significant improvement in overall quality of life, self-image, physical symptoms, and general health in studies assessing change in quality of life.  PHQ-9: Review Flowsheet       06/18/2024  Depression screen PHQ 2/9  Decreased Interest 1  Down, Depressed, Hopeless 1  PHQ - 2 Score 2  Altered sleeping 1  Tired, decreased energy 1  Change in appetite 0  Feeling bad or failure about yourself  0  Trouble concentrating 0  Moving slowly or fidgety/restless 0  Suicidal thoughts 0  PHQ-9 Score 4  Difficult doing work/chores Not difficult at all   Interpretation of Total Score  Total Score Depression Severity:  1-4 = Minimal depression, 5-9 = Mild depression, 10-14 = Moderate depression, 15-19 = Moderately severe depression, 20-27 = Severe depression   Psychosocial Evaluation and Intervention:  Psychosocial Evaluation - 06/18/24 1407       Psychosocial Evaluation & Interventions   Interventions Relaxation education;Encouraged  to exercise with the program and follow exercise prescription    Comments Pt states she has some financial and health related stress. She states she sees her therapist monthly and is medication compliant. She states she has good support from her husband and son. Tanda stated her son is a failure to launch, lives at home with her and her husband. She reports her faith has helped her through sobriety and she recently quit smoking on 05/25/2024. Nandika denies any additional psy/soc education, resources or referrals at this time.    Expected Outcomes For [place]a to use positive coping mechanisms to deal with her stress. To attend Pulm Rehab and exercise without any additional psy/soc barriers or concerns.    Continue Psychosocial Services  Follow up required by staff          Psychosocial Re-Evaluation:  Psychosocial Re-Evaluation     Row Name 06/21/24 1517             Psychosocial Re-Evaluation    Current issues with Current Stress Concerns       Comments 30 day psy/soc re-evaluation is as follows: No changes since orientation. Tawanna is scheduled to start the program next week.       Expected Outcomes For Rhia to use positive coping mechanisms to deal with her stress. To attend Pulm Rehab and exercise without any additional psy/soc barriers or concerns.       Interventions Encouraged to attend Pulmonary Rehabilitation for the exercise;Stress management education;Relaxation education       Continue Psychosocial Services  Follow up required by staff          Psychosocial Discharge (Final Psychosocial Re-Evaluation):  Psychosocial Re-Evaluation - 06/21/24 1517       Psychosocial Re-Evaluation   Current issues with Current Stress Concerns    Comments 30 day psy/soc re-evaluation is as follows: No changes since orientation. Meshelle is scheduled to start the program next week.    Expected Outcomes For Aislin to use positive coping mechanisms to deal with her stress. To attend Pulm Rehab and exercise without any additional psy/soc barriers or concerns.    Interventions Encouraged to attend Pulmonary Rehabilitation for the exercise;Stress management education;Relaxation education    Continue Psychosocial Services  Follow up required by staff          Education: Education Goals: Education classes will be provided on a weekly basis, covering required topics. Participant will state understanding/return demonstration of topics presented.  Learning Barriers/Preferences:  Learning Barriers/Preferences - 06/18/24 1351       Learning Barriers/Preferences   Learning Barriers None    Learning Preferences None          Education Topics: Know Your Numbers Group instruction that is supported by a PowerPoint presentation. Instructor discusses importance of knowing and understanding resting, exercise, and post-exercise oxygen saturation, heart rate, and blood pressure. Oxygen saturation, heart  rate, blood pressure, rating of perceived exertion, and dyspnea are reviewed along with a normal range for these values.  Flowsheet Row PULMONARY REHAB CHRONIC OBSTRUCTIVE PULMONARY DISEASE from 06/24/2024 in [facility]  Date 06/24/24  Educator EP  Instruction Review Code 1- Verbalizes Understanding    Exercise for the Pulmonary Patient Group instruction that is supported by a PowerPoint presentation. Instructor discusses benefits of exercise, core components of exercise, frequency, duration, and intensity of an exercise routine, importance of utilizing pulse oximetry during exercise, safety while exercising, and options of places to exercise outside of rehab.  MET Level  Group instruction provided by PowerPoint, verbal discussion, and written material to support subject matter. Instructor reviews what METs are and how to increase METs.    Pulmonary Medications Verbally interactive group education provided by instructor with focus on inhaled medications and proper administration.   Anatomy and Physiology of the Respiratory System Group instruction provided by PowerPoint, verbal discussion, and written material to support subject matter. Instructor reviews respiratory cycle and anatomical components of the respiratory system and their functions. Instructor also reviews differences in obstructive and restrictive respiratory diseases with examples of each.    Oxygen Safety Group instruction provided by PowerPoint, verbal discussion, and written material to support subject matter. There is an overview of What is Oxygen and Why do we need it.  Instructor also reviews how to create a safe environment for oxygen use, the importance of using oxygen as prescribed, and the risks of noncompliance. There is a brief discussion on traveling with oxygen and resources the patient may utilize.   Oxygen Use Group instruction provided by  PowerPoint, verbal discussion, and written material to discuss how supplemental oxygen is prescribed and different types of oxygen supply systems. Resources for more information are provided.    Breathing Techniques Group instruction that is supported by demonstration and informational handouts. Instructor discusses the benefits of pursed lip and diaphragmatic breathing and detailed demonstration on how to perform both.     Risk Factor Reduction Group instruction that is supported by a PowerPoint presentation. Instructor discusses the definition of a risk factor, different risk factors for pulmonary disease, and how the heart and lungs work together.   Pulmonary Diseases Group instruction provided by PowerPoint, verbal discussion, and written material to support subject matter. Instructor gives an overview of the different type of pulmonary diseases. There is also a discussion on risk factors and symptoms as well as ways to manage the diseases.   Stress and Energy Conservation Group instruction provided by PowerPoint, verbal discussion, and written material to support subject matter. Instructor gives an overview of stress and the impact it can have on the body. Instructor also reviews ways to reduce stress. There is also a discussion on energy conservation and ways to conserve energy throughout the day.   Warning Signs and Symptoms Group instruction provided by PowerPoint, verbal discussion, and written material to support subject matter. Instructor reviews warning signs and symptoms of stroke, heart attack, cold and flu. Instructor also reviews ways to prevent the spread of infection.   Other Education Group or individual verbal, written, or video instructions that support the educational goals of the pulmonary rehab program.    Knowledge Questionnaire Score:  Knowledge Questionnaire Score - 06/18/24 1351       Knowledge Questionnaire Score   Pre Score 17/18          Core  Components/Risk Factors/Patient Goals at Admission:  Personal Goals and Risk Factors at Admission - 06/18/24 1351       Core Components/Risk Factors/Patient Goals on Admission    Weight Management Weight Maintenance;Yes   Recently stopped smoking, doesn't want to gain weight, usually weight between 128-134#   Intervention Weight Management: Develop a combined nutrition and exercise program designed to reach desired caloric intake, while maintaining appropriate intake of nutrient and fiber, sodium and fats, and appropriate energy expenditure required for the weight goal.;Weight Management: Provide education and appropriate resources to help participant work on and attain dietary goals.    Admit Weight 140 lb 3.4 oz (63.6 kg)  Expected Outcomes Short Term: Continue to assess and modify interventions until short term weight is achieved;Long Term: Adherence to nutrition and physical activity/exercise program aimed toward attainment of established weight goal;Weight Maintenance: Understanding of the daily nutrition guidelines, which includes 25-35% calories from fat, 7% or less cal from saturated fats, less than 200mg  cholesterol, less than 1.5gm of sodium, & 5 or more servings of fruits and vegetables daily;Understanding recommendations for meals to include 15-35% energy as protein, 25-35% energy from fat, 35-60% energy from carbohydrates, less than 200mg  of dietary cholesterol, 20-35 gm of total fiber daily;Understanding of distribution of calorie intake throughout the day with the consumption of 4-5 meals/snacks    Improve shortness of breath with ADL's Yes    Intervention Provide education, individualized exercise plan and daily activity instruction to help decrease symptoms of SOB with activities of daily living.    Expected Outcomes Short Term: Improve cardiorespiratory fitness to achieve a reduction of symptoms when performing ADLs;Long Term: Be able to perform more ADLs without symptoms or delay the  onset of symptoms          Core Components/Risk Factors/Patient Goals Review:   Goals and Risk Factor Review     Row Name 06/21/24 1521             Core Components/Risk Factors/Patient Goals Review   Personal Goals Review Weight Management/Obesity;Improve shortness of breath with ADL's;Develop more efficient breathing techniques such as purse lipped breathing and diaphragmatic breathing and practicing self-pacing with activity.       Review 30 day Core Components/Risk Factors/Patient Goals Review is as follows: Unable to assess goals yet. Rubby is scheduled to start the program next week.       Expected Outcomes Pt will show progress toward meeting expected goals and outcomes.          Core Components/Risk Factors/Patient Goals at Discharge (Final Review):   Goals and Risk Factor Review - 06/21/24 1521       Core Components/Risk Factors/Patient Goals Review   Personal Goals Review Weight Management/Obesity;Improve shortness of breath with ADL's;Develop more efficient breathing techniques such as purse lipped breathing and diaphragmatic breathing and practicing self-pacing with activity.    Review 30 day Core Components/Risk Factors/Patient Goals Review is as follows: Unable to assess goals yet. Mirayah is scheduled to start the program next week.    Expected Outcomes Pt will show progress toward meeting expected goals and outcomes.          ITP Comments:   Comments: Pt is making expected progress toward Pulmonary Rehab goals after completing 1 session(s). Recommend continued exercise, life style modification, education, and utilization of breathing techniques to increase stamina and strength, while also decreasing shortness of breath with exertion.  Dr. Slater Staff is Medical Director for Pulmonary Rehab at [facility].       [1]  Current Outpatient Medications:    albuterol (VENTOLIN HFA) 108 (90 Base) MCG/ACT inhaler, Inhale 2 puffs into the lungs., Disp: , Rfl:     amLODipine (NORVASC) 10 MG tablet, Take 1 tablet by mouth daily., Disp: , Rfl:    bisoprolol (ZEBETA) 10 MG tablet, Take 10 mg by mouth. , Disp: , Rfl:    buPROPion (WELLBUTRIN XL) 300 MG 24 hr tablet, Take 300 mg by mouth., Disp: , Rfl:    Cholecalciferol (VITAMIN D ) 2000 UNITS tablet, Take 2,000 Units by mouth daily., Disp: , Rfl:    fluticasone-salmeterol (ADVAIR HFA) 115-21 MCG/ACT inhaler, Inhale 2 puffs into the lungs  2 (two) times daily., Disp: , Rfl:    losartan (COZAAR) 100 MG tablet, Take 100 mg by mouth daily., Disp: , Rfl:    montelukast (SINGULAIR) 10 MG tablet, Take 1 tablet by mouth at bedtime., Disp: , Rfl:    PARoxetine (PAXIL-CR) 25 MG 24 hr tablet, Take 25 mg by mouth daily., Disp: , Rfl:    simvastatin (ZOCOR) 40 MG tablet, Take 40 mg by mouth every evening., Disp: , Rfl:    vitamin B-12 (CYANOCOBALAMIN) 1000 MCG tablet, Take 2,000 mcg by mouth daily., Disp: , Rfl:  [2]  Social History Tobacco Use  Smoking Status Former   Current packs/day: 0.00   Average packs/day: 0.5 packs/day for 44.3 years (22.2 ttl pk-yrs)   Types: Cigarettes   Start date: 01/30/1980   Quit date: 05/25/2024   Years since quitting: 0.0   Passive exposure: Current  Smokeless Tobacco Never  Tobacco Comments   Quit smoking 05/25/24

## 2024-07-06 ENCOUNTER — Encounter (HOSPITAL_COMMUNITY)
Admission: RE | Admit: 2024-07-06 | Discharge: 2024-07-06 | Disposition: A | Discharge status: Home / Self Care | Source: Ambulatory Visit | Attending: Internal Medicine | Admitting: Internal Medicine

## 2024-07-06 DIAGNOSIS — J449 Chronic obstructive pulmonary disease, unspecified: Secondary | ICD-10-CM

## 2024-07-13 ENCOUNTER — Encounter (HOSPITAL_COMMUNITY)
Admission: RE | Admit: 2024-07-13 | Discharge: 2024-07-13 | Disposition: A | Discharge status: Home / Self Care | Source: Ambulatory Visit | Attending: Internal Medicine | Admitting: Internal Medicine

## 2024-07-13 VITALS — Wt 143.1 lb

## 2024-07-13 DIAGNOSIS — J449 Chronic obstructive pulmonary disease, unspecified: Secondary | ICD-10-CM | POA: Diagnosis present

## 2024-07-13 NOTE — Progress Notes (Signed)
 Daily Session Note  Patient Details  Name: Sherry Stone MRN: [id number] Date of Birth: 03/18/1956 Referring Provider:   Conrad Ports Pulmonary Rehab Walk Test from 06/18/2024 in [facility], [facility], & [facility]  Referring Provider Sharleen    Encounter Date: 07/13/2024  Check In:  Session Check In - 07/13/24 1318       Check-In   Supervising physician immediately available to respond to emergencies [facility] MD immediately available    Physician(s) Rosabel Mose, NP    Location [facility]    Staff Present Ronal Levin, RN, BSN;Randi Midge BS, ACSM-CEP, Exercise Physiologist;Nora Rooke Nicholaus, MS, ACSM-CEP, Exercise Physiologist;Joseph Lennon, RN, Wetzel Sharps, RT    Virtual Visit No    Medication changes reported     No    Fall or balance concerns reported    Yes    Tobacco Cessation Use Decreased   Quit smoking 05/25/24   Current number of cigarettes/nicotine per day     0    Warm-up and Cool-down Performed as group-led instruction    Resistance Training Performed No    VAD Patient? No    PAD/SET Patient? No      Pain Assessment   Currently in Pain? No/denies    Multiple Pain Sites No          Capillary Blood Glucose: No results found for this or any previous visit (from the past 24 hours).   Exercise Prescription Changes - 07/13/24 1400       Response to Exercise   Blood Pressure (Admit) 128/70    Blood Pressure (Exercise) 122/64    Blood Pressure (Exit) 92/58    Heart Rate (Admit) 90 bpm    Heart Rate (Exercise) 97 bpm    Heart Rate (Exit) 79 bpm    Oxygen Saturation (Admit) 98 %    Oxygen Saturation (Exercise) 92 %    Oxygen Saturation (Exit) 96 %    Rating of Perceived Exertion (Exercise) 9    Perceived Dyspnea (Exercise) 0.5    Duration Continue with 30 min of aerobic exercise without signs/symptoms of physical distress.    Intensity THRR unchanged      Progression   Progression Continue to progress  workloads to maintain intensity without signs/symptoms of physical distress.      Resistance Training   Weight red bands    Reps 10-15    Time 10 Minutes      Recumbant Elliptical   Level 2    Minutes 15    METs 1.6      Track   Laps 5    Minutes 15          Tobacco Use History[1]  Goals Met:  Proper associated with RPD/PD & O2 Sat Exercise tolerated well No report of concerns or symptoms today Strength training completed today  Goals Unmet:  Not Applicable  Comments: Service time is from 1303 to 1437.    Dr. Slater Staff is Medical Director for Pulmonary Rehab at [facility].     [1]  Social History Tobacco Use  Smoking Status Former   Current packs/day: 0.00   Average packs/day: 0.5 packs/day for 44.3 years (22.2 ttl pk-yrs)   Types: Cigarettes   Start date: 01/30/1980   Quit date: 05/25/2024   Years since quitting: 0.1   Passive exposure: Current  Smokeless Tobacco Never  Tobacco Comments   Quit smoking 05/25/24

## 2024-07-15 ENCOUNTER — Encounter (HOSPITAL_COMMUNITY)
Admission: RE | Admit: 2024-07-15 | Discharge: 2024-07-15 | Disposition: A | Discharge status: Home / Self Care | Source: Ambulatory Visit | Attending: Internal Medicine | Admitting: Internal Medicine

## 2024-07-15 DIAGNOSIS — J449 Chronic obstructive pulmonary disease, unspecified: Secondary | ICD-10-CM | POA: Diagnosis not present

## 2024-07-15 NOTE — Progress Notes (Signed)
 Daily Session Note  Patient Details  Name: Sherry Stone MRN: [id number] Date of Birth: 12/15/1955 Referring Provider:   Conrad Ports Pulmonary Rehab Walk Test from 06/18/2024 in [facility] for Heart, Vascular, & Lung Health  Referring Provider Sharleen    Encounter Date: 07/15/2024  Check In:  Session Check In - 07/15/24 1321       Check-In   Supervising physician immediately available to respond to emergencies [facility] MD immediately available    Physician(s) Damien Braver, NP    Location [facility]    Staff Present Ronal Levin, RN, BSN;Randi Reeve BS, ACSM-CEP, Exercise Physiologist;Sammi Stolarz Claudene, RT    Virtual Visit No    Medication changes reported     No    Fall or balance concerns reported    Yes    Tobacco Cessation Use Decreased   Quit smoking 05/25/24   Current number of cigarettes/nicotine per day     0    Warm-up and Cool-down Performed as group-led instruction    Resistance Training Performed Yes    VAD Patient? No    PAD/SET Patient? No      Pain Assessment   Currently in Pain? No/denies    Multiple Pain Sites No          Capillary Blood Glucose: No results found for this or any previous visit (from the past 24 hours).    Tobacco Use History[1]  Goals Met:  Proper associated with RPD/PD & O2 Sat Independence with exercise equipment Exercise tolerated well No report of concerns or symptoms today Strength training completed today  Goals Unmet:  Not Applicable  Comments: Service time is from 1305 to 1434.    Dr. Slater Staff is Medical Director for Pulmonary Rehab at [facility].     [1]  Social History Tobacco Use  Smoking Status Former   Current packs/day: 0.00   Average packs/day: 0.5 packs/day for 44.3 years (22.2 ttl pk-yrs)   Types: Cigarettes   Start date: 01/30/1980   Quit date: 05/25/2024   Years since quitting: 0.1   Passive exposure: Current  Smokeless Tobacco Never  Tobacco  Comments   Quit smoking 05/25/24

## 2024-07-20 ENCOUNTER — Encounter (HOSPITAL_COMMUNITY)
Admission: RE | Admit: 2024-07-20 | Discharge: 2024-07-20 | Disposition: A | Source: Ambulatory Visit | Attending: Internal Medicine | Admitting: Internal Medicine

## 2024-07-20 DIAGNOSIS — J449 Chronic obstructive pulmonary disease, unspecified: Secondary | ICD-10-CM | POA: Diagnosis not present

## 2024-07-20 NOTE — Progress Notes (Signed)
 Daily Session Note  Patient Details  Name: Sherry Stone MRN: [id number] Date of Birth: [birth year] Referring Provider:   Conrad Ports Pulmonary Rehab Walk Test from 06/18/2024 in [facility] for Heart, Vascular, & Lung Health  Referring Provider Sharleen    Encounter Date: 07/20/2024  Check In:  Session Check In - 07/20/24 1351       Check-In   Supervising physician immediately available to respond to emergencies [facility] MD immediately available    Physician(s) Rosaline Skains, NP    [facility]    Staff Present Ronal Levin, RN, BSN;Randi Midge HECKLE, ACSM-CEP, Exercise Physiologist;Prajwal Fellner Claudene West Quan, RN, Maud Moats, MS, ACSM-CEP, Exercise Physiologist    Virtual Visit No    Medication changes reported     No    Fall or balance concerns reported    Yes    Tobacco Cessation Use Decreased   Quit smoking 05/25/24   Current number of cigarettes/nicotine per day     0    Warm-up and Cool-down Performed as group-led instruction    Resistance Training Performed Yes    VAD Patient? No    PAD/SET Patient? No      Pain Assessment   Currently in Pain? No/denies    Multiple Pain Sites No          Capillary Blood Glucose: No results found for this or any previous visit (from the past 24 hours).    Tobacco Use History[1]  Goals Met:  Proper associated with RPD/PD & O2 Sat Independence with exercise equipment Exercise tolerated well No report of concerns or symptoms today Strength training completed today  Goals Unmet:  Not Applicable  Comments: Service time is from 1308 to 1439.    Dr. Slater Staff is Medical Director for Pulmonary Rehab at [facility].     [1]  Social History Tobacco Use  Smoking Status Former   Current packs/day: 0.00   Average packs/day: 0.5 packs/day for 44.3 years (22.2 ttl pk-yrs)   Types: Cigarettes   Start date: 01/30/1980   Quit date: 05/25/2024   Years since  quitting: 0.1   Passive exposure: Current  Smokeless Tobacco Never  Tobacco Comments   Quit smoking 05/25/24

## 2024-07-22 ENCOUNTER — Encounter (HOSPITAL_COMMUNITY)

## 2024-07-22 ENCOUNTER — Telehealth (HOSPITAL_COMMUNITY): Payer: Self-pay

## 2024-07-22 NOTE — Telephone Encounter (Signed)
 Spoke to pt regarding missing Pulm Rehab today. Pt stated she has a GI bug. Will hopefully be back to class next week

## 2024-07-23 ENCOUNTER — Telehealth (HOSPITAL_COMMUNITY): Payer: Self-pay

## 2024-07-23 NOTE — Telephone Encounter (Signed)
 Patient left message calling out for 1:15 PR class on 1/15, stated she was having stomach issues and took a nap which made her sleep past the appt.

## 2024-07-27 ENCOUNTER — Encounter (HOSPITAL_COMMUNITY)
Admission: RE | Admit: 2024-07-27 | Discharge: 2024-07-27 | Disposition: A | Source: Ambulatory Visit | Attending: Internal Medicine | Admitting: Internal Medicine

## 2024-07-27 VITALS — Wt 142.4 lb

## 2024-07-27 DIAGNOSIS — J449 Chronic obstructive pulmonary disease, unspecified: Secondary | ICD-10-CM

## 2024-07-27 NOTE — Progress Notes (Signed)
 Daily Session Note  Patient Details  Name: Sherry Stone MRN: [id number] Date of Birth: 05/06/1956 Referring Provider:   Conrad Ports Pulmonary Rehab Walk Test from 06/18/2024 in [facility] for Heart, Vascular, & Lung Health  Referring Provider Sharleen    Encounter Date: 07/27/2024  Check In:  Session Check In - 07/27/24 1318       Check-In   Supervising physician immediately available to respond to emergencies [facility] MD immediately available    Physician(s) Lum Louis, NP    [facility]    Staff Present Ronal Levin, RN, BSN;Randi Midge BS, ACSM-CEP, Exercise Physiologist;Casey Claudene Neita Moats, MS, ACSM-CEP, Exercise Physiologist    Virtual Visit No    Medication changes reported     No    Fall or balance concerns reported    Yes    Tobacco Cessation No Change    Current number of cigarettes/nicotine per day     0    Warm-up and Cool-down Performed as group-led instruction    Resistance Training Performed Yes    VAD Patient? No    PAD/SET Patient? No      Pain Assessment   Currently in Pain? No/denies    Multiple Pain Sites No          Capillary Blood Glucose: No results found for this or any previous visit (from the past 24 hours).   Exercise Prescription Changes - 07/27/24 1400       Response to Exercise   Blood Pressure (Admit) 100/60    Blood Pressure (Exercise) 112/60    Blood Pressure (Exit) 90/50    Heart Rate (Admit) 73 bpm    Heart Rate (Exercise) 83 bpm    Heart Rate (Exit) 76 bpm    Oxygen Saturation (Admit) 98 %    Oxygen Saturation (Exercise) 94 %    Oxygen Saturation (Exit) 96 %    Rating of Perceived Exertion (Exercise) 11    Perceived Dyspnea (Exercise) 1    Duration Continue with 30 min of aerobic exercise without signs/symptoms of physical distress.    Intensity THRR unchanged      Progression   Progression Continue to progress workloads to maintain intensity without  signs/symptoms of physical distress.      Resistance Training   Weight red bands    Reps 10-15    Time 10 Minutes      Recumbant Elliptical   Level 2    Minutes 15    METs 1.7      Track   Laps 6   271ft track   Minutes 15    METs 1.92          Tobacco Use History[1]  Goals Met:  Independence with exercise equipment Exercise tolerated well Queuing for purse lip breathing No report of concerns or symptoms today Strength training completed today  Goals Unmet:  Not Applicable  Comments: Service time is from 1303 to 1433    Dr. Slater Staff is Medical Director for Pulmonary Rehab at [facility].     [1]  Social History Tobacco Use  Smoking Status Former   Current packs/day: 0.00   Average packs/day: 0.5 packs/day for 44.3 years (22.2 ttl pk-yrs)   Types: Cigarettes   Start date: 01/30/1980   Quit date: 05/25/2024   Years since quitting: 0.1   Passive exposure: Current  Smokeless Tobacco Never  Tobacco Comments   Quit smoking 05/25/24

## 2024-07-28 NOTE — Progress Notes (Signed)
 Pulmonary Individual Treatment Plan  Patient Details  Name: Sherry Stone MRN: [id number] Date of Birth: March 09, 1956 Referring Provider:   Conrad Ports Pulmonary Rehab Walk Test from 06/18/2024 in [facility]  Referring Provider Javaid    Initial Encounter Date:  Flowsheet Row Pulmonary Rehab Walk Test from 06/18/2024 in [facility]  Date 06/18/24    Visit Diagnosis: Stage 3 severe COPD by GOLD classification (HCC)  Patient's Home Medications on Admission:  Current Medications[1]  Past Medical History: Past Medical History:  Diagnosis Date   Breast cancer (HCC)    C2 cervical fracture (HCC)    Degenerative disc disease    Depression    Endometriosis    History of radiation therapy    Hyperlipidemia    Hypertension    Pernicious anemia    Substance abuse (HCC)     Tobacco Use: Tobacco Use History[2]  Labs: Review Flowsheet        No data to display          Capillary Blood Glucose: Lab Results  Component Value Date   GLUCAP 116 (H) 01/23/2023     Pulmonary Assessment Scores:  Pulmonary Assessment Scores     Row Name 06/18/24 1510         ADL UCSD   ADL Phase Entry     SOB Score total 29       CAT Score   CAT Score 9       mMRC Score   mMRC Score 3       UCSD: Self-administered rating of dyspnea associated with activities of daily living (ADLs) 6-point scale (0 = not at all to 5 = maximal or unable to do because of breathlessness)  Scoring Scores range from 0 to 120.  Minimally important difference is 5 units  CAT: CAT can identify the health impairment of COPD patients and is better correlated with disease progression.  CAT has a scoring range of zero to 40. The CAT score is classified into four groups of low (less than 10), medium (10 - 20), high (21-30) and very high (31-40) based on the impact level of disease on  health status. A CAT score over 10 suggests significant symptoms.  A worsening CAT score could be explained by an exacerbation, poor medication adherence, poor inhaler technique, or progression of COPD or comorbid conditions.  CAT MCID is 2 points  mMRC: mMRC (Modified Medical Research Council) Dyspnea Scale is used to assess the degree of baseline functional disability in patients of respiratory disease due to dyspnea. No minimal important difference is established. A decrease in score of 1 point or greater is considered a positive change.   Pulmonary Function Assessment:  Pulmonary Function Assessment - 06/18/24 1351       Breath   Bilateral Breath Sounds Decreased    Shortness of Breath Yes;Limiting activity          Exercise Target Goals: Exercise Program Goal: Individual exercise prescription set using results from initial 6 min walk test and THRR while considering  patients activity barriers and safety.   Exercise Prescription Goal: Initial exercise prescription builds to 30-45 minutes a day of aerobic activity, 2-3 days per week.  Home exercise guidelines will be given to patient during program as part of exercise prescription that the participant will acknowledge.  Activity Barriers & Risk Stratification:  Activity Barriers & Cardiac Risk Stratification -  06/18/24 1509       Activity Barriers & Cardiac Risk Stratification   Activity Barriers Deconditioning;Muscular Weakness;Shortness of Breath;History of Falls;Balance Concerns          6 Minute Walk:  6 Minute Walk     Row Name 06/18/24 1520         6 Minute Walk   Phase Initial     Distance 840 feet     Walk Time 6 minutes     # of Rest Breaks 0     MPH 1.59     METS 1.93     RPE 11     Perceived Dyspnea  3     VO2 Peak 6.77     Symptoms No     Resting HR 72 bpm     Resting BP 114/62     Resting Oxygen Saturation  94 %     Exercise Oxygen Saturation  during 6 min walk 92 %     Max Ex. HR 89 bpm      Max Ex. BP 122/68     2 Minute Post BP 110/66       Interval HR   1 Minute HR 79     2 Minute HR 83     3 Minute HR 83     4 Minute HR 84     5 Minute HR 84     6 Minute HR 89     2 Minute Post HR 76     Interval Heart Rate? Yes       Interval Oxygen   Interval Oxygen? Yes     Baseline Oxygen Saturation % 94 %     1 Minute Oxygen Saturation % 94 %     1 Minute Liters of Oxygen 0 L     2 Minute Oxygen Saturation % 93 %     2 Minute Liters of Oxygen 0 L     3 Minute Oxygen Saturation % 94 %     3 Minute Liters of Oxygen 0 L     4 Minute Oxygen Saturation % 92 %     4 Minute Liters of Oxygen 0 L     5 Minute Oxygen Saturation % 94 %     5 Minute Liters of Oxygen 0 L     6 Minute Oxygen Saturation % 97 %     6 Minute Liters of Oxygen 0 L     2 Minute Post Oxygen Saturation % 98 %     2 Minute Post Liters of Oxygen 0 L        Oxygen Initial Assessment:  Oxygen Initial Assessment - 06/18/24 1351       Home Oxygen   Home Oxygen Device None    Sleep Oxygen Prescription None    Home Exercise Oxygen Prescription None    Home Resting Oxygen Prescription None      Initial 6 min Walk   Oxygen Used None      Program Oxygen Prescription   Program Oxygen Prescription None      Intervention   Short Term Goals To learn and demonstrate proper use of respiratory medications;To learn and understand importance of maintaining oxygen saturations>88%;To learn and exhibit compliance with exercise, home and travel O2 prescription;To learn and understand importance of monitoring SPO2 with pulse oximeter and demonstrate accurate use of the pulse oximeter.;To learn and demonstrate proper pursed lip breathing techniques or other breathing techniques.     Long  Term Goals Verbalizes importance of monitoring SPO2 with pulse oximeter and return demonstration;Exhibits proper breathing techniques, such as pursed lip breathing or other method taught during program session;Demonstrates proper use of  MDIs;Compliance with respiratory medication;Maintenance of O2 saturations>88%;Exhibits compliance with exercise, home  and travel O2 prescription          Oxygen Re-Evaluation:  Oxygen Re-Evaluation     Row Name 06/25/24 1415 07/23/24 1123           Program Oxygen Prescription   Program Oxygen Prescription None None        Home Oxygen   Home Oxygen Device None None      Sleep Oxygen Prescription None None      Home Exercise Oxygen Prescription None None      Home Resting Oxygen Prescription None None        Goals/Expected Outcomes   Short Term Goals To learn and demonstrate proper use of respiratory medications;To learn and understand importance of maintaining oxygen saturations>88%;To learn and exhibit compliance with exercise, home and travel O2 prescription;To learn and understand importance of monitoring SPO2 with pulse oximeter and demonstrate accurate use of the pulse oximeter.;To learn and demonstrate proper pursed lip breathing techniques or other breathing techniques.  To learn and demonstrate proper use of respiratory medications;To learn and understand importance of maintaining oxygen saturations>88%;To learn and exhibit compliance with exercise, home and travel O2 prescription;To learn and understand importance of monitoring SPO2 with pulse oximeter and demonstrate accurate use of the pulse oximeter.;To learn and demonstrate proper pursed lip breathing techniques or other breathing techniques.       Long  Term Goals Verbalizes importance of monitoring SPO2 with pulse oximeter and return demonstration;Exhibits proper breathing techniques, such as pursed lip breathing or other method taught during program session;Demonstrates proper use of MDIs;Compliance with respiratory medication;Maintenance of O2 saturations>88%;Exhibits compliance with exercise, home  and travel O2 prescription Verbalizes importance of monitoring SPO2 with pulse oximeter and return demonstration;Exhibits  proper breathing techniques, such as pursed lip breathing or other method taught during program session;Demonstrates proper use of MDIs;Compliance with respiratory medication;Maintenance of O2 saturations>88%;Exhibits compliance with exercise, home  and travel O2 prescription      Goals/Expected Outcomes Compliance and understanding of oxygen saturation monitoring and breath techniques to decrease shortness of breath. Compliance and understanding of oxygen saturation monitoring and breath techniques to decrease shortness of breath.         Oxygen Discharge (Final Oxygen Re-Evaluation):  Oxygen Re-Evaluation - 07/23/24 1123       Program Oxygen Prescription   Program Oxygen Prescription None      Home Oxygen   Home Oxygen Device None    Sleep Oxygen Prescription None    Home Exercise Oxygen Prescription None    Home Resting Oxygen Prescription None      Goals/Expected Outcomes   Short Term Goals To learn and demonstrate proper use of respiratory medications;To learn and understand importance of maintaining oxygen saturations>88%;To learn and exhibit compliance with exercise, home and travel O2 prescription;To learn and understand importance of monitoring SPO2 with pulse oximeter and demonstrate accurate use of the pulse oximeter.;To learn and demonstrate proper pursed lip breathing techniques or other breathing techniques.     Long  Term Goals Verbalizes importance of monitoring SPO2 with pulse oximeter and return demonstration;Exhibits proper breathing techniques, such as pursed lip breathing or other method taught during program session;Demonstrates proper use of MDIs;Compliance with respiratory medication;Maintenance of O2 saturations>88%;Exhibits compliance with exercise, home  and travel  O2 prescription    Goals/Expected Outcomes Compliance and understanding of oxygen saturation monitoring and breath techniques to decrease shortness of breath.          Initial Exercise  Prescription:  Initial Exercise Prescription - 06/18/24 1400       Date of Initial Exercise RX and Referring Provider   Date 06/18/24    Referring Provider Javaid    Expected Discharge Date 09/21/24      Recumbant Elliptical   Level 1    RPM 35    Watts 15    Minutes 15    METs 2.1      Track   Minutes 15    METs 2      Prescription Details   Frequency (times per week) 2    Duration Progress to 30 minutes of continuous aerobic without signs/symptoms of physical distress      Intensity   THRR 40-80% of Max Heartrate 60-120    Ratings of Perceived Exertion 11-13    Perceived Dyspnea 0-4      Progression   Progression Continue progressive overload as per policy without signs/symptoms or physical distress.      Resistance Training   Training Prescription Yes    Weight red bands    Reps 10-15          Perform Capillary Blood Glucose checks as needed.  Exercise Prescription Changes:   Exercise Prescription Changes     Row Name 06/24/24 1512 07/13/24 1400 07/27/24 1400         Response to Exercise   Blood Pressure (Admit) 108/70 128/70 100/60     Blood Pressure (Exercise) 112/72 122/64 112/60     Blood Pressure (Exit) 98/64 92/58 90/50      Heart Rate (Admit) 78 bpm 90 bpm 73 bpm     Heart Rate (Exercise) 85 bpm 97 bpm 83 bpm     Heart Rate (Exit) 81 bpm 79 bpm 76 bpm     Oxygen Saturation (Admit) 97 % 98 % 98 %     Oxygen Saturation (Exercise) 94 % 92 % 94 %     Oxygen Saturation (Exit) 96 % 96 % 96 %     Rating of Perceived Exertion (Exercise) 10 9 11      Perceived Dyspnea (Exercise) 1 0.5 1     Duration Continue with 30 min of aerobic exercise without signs/symptoms of physical distress. Continue with 30 min of aerobic exercise without signs/symptoms of physical distress. Continue with 30 min of aerobic exercise without signs/symptoms of physical distress.     Intensity THRR unchanged THRR unchanged THRR unchanged       Progression   Progression Continue  to progress workloads to maintain intensity without signs/symptoms of physical distress. Continue to progress workloads to maintain intensity without signs/symptoms of physical distress. Continue to progress workloads to maintain intensity without signs/symptoms of physical distress.       Resistance Training   Weight red bands red bands red bands     Reps 10-15 10-15 10-15     Time 10 Minutes 10 Minutes 10 Minutes       Recumbant Elliptical   Level 1 2 2      RPM 35 -- --     Minutes 15 15 15      METs 2 1.6 1.7       Track   Laps 9 5 6   250ft track     Minutes 15 15 15      METs 2.1 --  1.92        Exercise Comments:   Exercise Goals and Review:   Exercise Goals Re-Evaluation :  Exercise Goals Re-Evaluation     Row Name 06/25/24 1413 07/23/24 1120           Exercise Goal Re-Evaluation   Exercise Goals Review Increase Physical Activity;Able to understand and use Dyspnea scale;Understanding of Exercise Prescription;Increase Strength and Stamina;Knowledge and understanding of Target Heart Rate Range (THRR);Able to understand and use rate of perceived exertion (RPE) scale Increase Physical Activity;Able to understand and use Dyspnea scale;Understanding of Exercise Prescription;Increase Strength and Stamina;Knowledge and understanding of Target Heart Rate Range (THRR);Able to understand and use rate of perceived exertion (RPE) scale      Comments Pt completed her first day of group exercise. She exercised on the recumbent elliptical for 15 min, level 1, 35 rpm, METs 2. She then walked the track for 15 min, 2.12 METs. She voices walking is difficult but willing to work on it. Will progress as tolerated. She performed warm up and cool down standing with red bands, 3.7 lbs. Pt has completed 5 days of group exercise. She exercises on the recumbent elliptical for 15 min, level 2,  METs 2. She then walks the track for 15 min, 2.08 METs. She is doing well and progressing slowly. Will progress  as tolerated. She performed warm up and cool down standing with red bands, 3.7 lbs. She does squats. She is enjoying exercise.      Expected Outcomes Through exercise in rehab and at home, the patient will decrease shortness of breath with daily activities and feel confident in doing an exercise regimen at home. Through exercise in rehab and at home, the patient will decrease shortness of breath with daily activities and feel confident in doing an exercise regimen at home.         Discharge Exercise Prescription (Final Exercise Prescription Changes):  Exercise Prescription Changes - 07/27/24 1400       Response to Exercise   Blood Pressure (Admit) 100/60    Blood Pressure (Exercise) 112/60    Blood Pressure (Exit) 90/50    Heart Rate (Admit) 73 bpm    Heart Rate (Exercise) 83 bpm    Heart Rate (Exit) 76 bpm    Oxygen Saturation (Admit) 98 %    Oxygen Saturation (Exercise) 94 %    Oxygen Saturation (Exit) 96 %    Rating of Perceived Exertion (Exercise) 11    Perceived Dyspnea (Exercise) 1    Duration Continue with 30 min of aerobic exercise without signs/symptoms of physical distress.    Intensity THRR unchanged      Progression   Progression Continue to progress workloads to maintain intensity without signs/symptoms of physical distress.      Resistance Training   Weight red bands    Reps 10-15    Time 10 Minutes      Recumbant Elliptical   Level 2    Minutes 15    METs 1.7      Track   Laps 6   235ft track   Minutes 15    METs 1.92          Nutrition:  Target Goals: Understanding of nutrition guidelines, daily intake of sodium 1500mg , cholesterol 200mg , calories 30% from fat and 7% or less from saturated fats, daily to have 5 or more servings of fruits and vegetables.  Biometrics:  Pre Biometrics - 06/18/24 1509       Pre Biometrics  Grip Strength 10 kg           Nutrition Therapy Plan and Nutrition Goals:  Nutrition Therapy & Goals - 06/24/24 1433        Nutrition Therapy   Diet Regular diet    Drug/Food Interactions Statins/Certain Fruits      Personal Nutrition Goals   Nutrition Goal Patient to improve diet quality by using the plate method as a guide for meal planning to include lean protein/plant protein, fruits, vegetables, whole grains, nonfat dairy as part of a well-balanced diet.    Comments Pt with medical history significant for COPD. Pt reports limited intake of vegetables due to husband's preferences; however plans to prepare desired vegetables such as cabbage for self. Also reports typically skipping breakfast in past for weight management. States smoking also suppressed appetite; reports quitting tobacco use ~ 1 month ago. Recently started eating earlier meal to help fuel body in preparation for rehab. RD discussed importance of adequate calories, protein to fuel body and maintain lean body mass. Patient will benefit from participation in pulmonary rehab for nutrition education, exercise, and lifestyle modification.      Intervention Plan   Intervention Prescribe, educate and counsel regarding individualized specific dietary modifications aiming towards targeted core components such as weight, hypertension, lipid management, diabetes, heart failure and other comorbidities.    Expected Outcomes Short Term Goal: Understand basic principles of dietary content, such as calories, fat, sodium, cholesterol and nutrients.;Long Term Goal: Adherence to prescribed nutrition plan.          Nutrition Assessments:  MEDIFICTS Score Key: >=70 Need to make dietary changes  40-70 Heart Healthy Diet <= 40 Therapeutic Level Cholesterol Diet   Picture Your Plate Scores: <59 Unhealthy dietary pattern with much room for improvement. 41-50 Dietary pattern unlikely to meet recommendations for good health and room for improvement. 51-60 More healthful dietary pattern, with some room for improvement.  >60 Healthy dietary pattern, although  there may be some specific behaviors that could be improved.    Nutrition Goals Re-Evaluation:  Nutrition Goals Re-Evaluation     Row Name 07/20/24 1404             Goals   Current Weight 140 lb 6.4 oz (63.7 kg)       Nutrition Goal Patient to improve diet quality by using the plate method as a guide for meal planning to include lean protein/plant protein, fruits, vegetables, whole grains, nonfat dairy as part of a well-balanced diet.       Comment Wt gain of 0.8 lb over past month.       Expected Outcome Patient currently in preparation stage. Non-significant wt gain noted over past month. 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 States sometimes waiting until of end of day to eat. Also reports limited vegetable intake due to husband's and son's food preferences. Plans to start making changes by eating breakfast on more regular schedule. RD provided suggestions for simple breakfast ideas.          Nutrition Goals Discharge (Final Nutrition Goals Re-Evaluation):  Nutrition Goals Re-Evaluation - 07/20/24 1404       Goals   Current Weight 140 lb 6.4 oz (63.7 kg)    Nutrition Goal Patient to improve diet quality by using the plate method as a guide for meal planning to include lean protein/plant protein, fruits, vegetables, whole grains, nonfat dairy as part of a well-balanced diet.    Comment Wt gain  of 0.8 lb over past month.    Expected Outcome Patient currently in preparation stage. Non-significant wt gain noted over past month. 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 States sometimes waiting until of end of day to eat. Also reports limited vegetable intake due to husband's and son's food preferences. Plans to start making changes by eating breakfast on more regular schedule. RD provided suggestions for simple breakfast ideas.          Psychosocial: Target Goals: Acknowledge presence or absence  of significant depression and/or stress, maximize coping skills, provide positive support system. Participant is able to verbalize types and ability to use techniques and skills needed for reducing stress and depression.  Initial Review & Psychosocial Screening:  Initial Psych Review & Screening - 06/18/24 1402       Initial Review   Current issues with Current Stress Concerns    Source of Stress Concerns Financial;Chronic Illness    Comments Pt states she has some financial and health related stress. She states she sees her therapist monthly and is medication compliant. She states she has good support from her husband and son. Briel stated her son is a failure to launch, lives at home with her and her husband. She reports her faith has helped her through sobriety and she recently quit smoking on 05/25/2024. Jhordan denies any additional psy/soc education, resources or referrals at this time.      Family Dynamics   Good Support System? Yes    Comments Good support with husband and son      Barriers   Psychosocial barriers to participate in program The patient should benefit from training in stress management and relaxation.      Screening Interventions   Interventions Encouraged to exercise;Provide feedback about the scores to participant    Expected Outcomes Short Term goal: Utilizing psychosocial counselor, staff and physician to assist with identification of specific Stressors or current issues interfering with healing process. Setting desired goal for each stressor or current issue identified.;Long Term Goal: Stressors or current issues are controlled or eliminated.;Short Term goal: Identification and review with participant of any Quality of Life or Depression concerns found by scoring the questionnaire.;Long Term goal: The participant improves quality of Life and PHQ9 Scores as seen by post scores and/or verbalization of changes          Quality of Life Scores:  Scores of 19 and  below usually indicate a poorer quality of life in these areas.  A difference of  2-3 points is a clinically meaningful difference.  A difference of 2-3 points in the total score of the Quality of Life Index has been associated with significant improvement in overall quality of life, self-image, physical symptoms, and general health in studies assessing change in quality of life.  PHQ-9: Review Flowsheet       06/18/2024  Depression screen PHQ 2/9  Decreased Interest 1  Down, Depressed, Hopeless 1  PHQ - 2 Score 2  Altered sleeping 1  Tired, decreased energy 1  Change in appetite 0  Feeling bad or failure about yourself  0  Trouble concentrating 0  Moving slowly or fidgety/restless 0  Suicidal thoughts 0  PHQ-9 Score 4  Difficult doing work/chores Not difficult at all   Interpretation of Total Score  Total Score Depression Severity:  1-4 = Minimal depression, 5-9 = Mild depression, 10-14 = Moderate depression, 15-19 = Moderately severe depression, 20-27 = Severe depression   Psychosocial  Evaluation and Intervention:  Psychosocial Evaluation - 06/18/24 1407       Psychosocial Evaluation & Interventions   Interventions Relaxation education;Encouraged to exercise with the program and follow exercise prescription    Comments Pt states she has some financial and health related stress. She states she sees her therapist monthly and is medication compliant. She states she has good support from her husband and son. Chene stated her son is a failure to launch, lives at home with her and her husband. She reports her faith has helped her through sobriety and she recently quit smoking on 05/25/2024. Milena denies any additional psy/soc education, resources or referrals at this time.    Expected Outcomes For Florabelle to use positive coping mechanisms to deal with her stress. To attend Pulm Rehab and exercise without any additional psy/soc barriers or concerns.    Continue Psychosocial Services   Follow up required by staff          Psychosocial Re-Evaluation:  Psychosocial Re-Evaluation     Row Name 06/21/24 1517 07/21/24 0907           Psychosocial Re-Evaluation   Current issues with Current Stress Concerns Current Stress Concerns      Comments 30 day psy/soc re-evaluation is as follows: No changes since orientation. Caidynce is scheduled to start the program next week. Monthly psychosocial re-evaluation as follows: Paula reports no new changes in her psy/soc wellbeing. She states finances are tight living on a fixed income with inflation. Education has been taught and provided on stress relief and relaxation. She stated she has not tried them yet, but she will. Belanna enjoys coming to class for the socialization and exercise. She declines any additional needs, resources, or referrals for her psy/soc well-being.      Expected Outcomes For Micala to use positive coping mechanisms to deal with her stress. To attend Pulm Rehab and exercise without any additional psy/soc barriers or concerns. For Shaton to use positive coping mechanisms to deal with her stress. To attend Pulm Rehab and exercise without any additional psy/soc barriers or concerns.      Interventions Encouraged to attend Pulmonary Rehabilitation for the exercise;Stress management education;Relaxation education Encouraged to attend Pulmonary Rehabilitation for the exercise;Stress management education;Relaxation education      Continue Psychosocial Services  Follow up required by staff Follow up required by staff         Psychosocial Discharge (Final Psychosocial Re-Evaluation):  Psychosocial Re-Evaluation - 07/21/24 0907       Psychosocial Re-Evaluation   Current issues with Current Stress Concerns    Comments Monthly psychosocial re-evaluation as follows: Saundra reports no new changes in her psy/soc wellbeing. She states finances are tight living on a fixed income with inflation. Education has been taught and provided on  stress relief and relaxation. She stated she has not tried them yet, but she will. Janda enjoys coming to class for the socialization and exercise. She declines any additional needs, resources, or referrals for her psy/soc well-being.    Expected Outcomes For Shawnya to use positive coping mechanisms to deal with her stress. To attend Pulm Rehab and exercise without any additional psy/soc barriers or concerns.    Interventions Encouraged to attend Pulmonary Rehabilitation for the exercise;Stress management education;Relaxation education    Continue Psychosocial Services  Follow up required by staff          Education: Education Goals: Education classes will be provided on a weekly basis, covering required topics. Participant will state understanding/return demonstration  of topics presented.  Learning Barriers/Preferences:  Learning Barriers/Preferences - 06/18/24 1351       Learning Barriers/Preferences   Learning Barriers None    Learning Preferences None          Education Topics: Know Your Numbers Group instruction that is supported by a PowerPoint presentation. Instructor discusses importance of knowing and understanding resting, exercise, and post-exercise oxygen saturation, heart rate, and blood pressure. Oxygen saturation, heart rate, blood pressure, rating of perceived exertion, and dyspnea are reviewed along with a normal range for these values.  Flowsheet Row PULMONARY REHAB CHRONIC OBSTRUCTIVE PULMONARY DISEASE from 06/24/2024 in [facility]  Date 06/24/24  Educator EP  Instruction Review Code 1- Verbalizes Understanding    Exercise for the Pulmonary Patient Group instruction that is supported by a PowerPoint presentation. Instructor discusses benefits of exercise, core components of exercise, frequency, duration, and intensity of an exercise routine, importance of utilizing pulse oximetry during exercise, safety  while exercising, and options of places to exercise outside of rehab.    MET Level  Group instruction provided by PowerPoint, verbal discussion, and written material to support subject matter. Instructor reviews what METs are and how to increase METs.    Pulmonary Medications Verbally interactive group education provided by instructor with focus on inhaled medications and proper administration.   Anatomy and Physiology of the Respiratory System Group instruction provided by PowerPoint, verbal discussion, and written material to support subject matter. Instructor reviews respiratory cycle and anatomical components of the respiratory system and their functions. Instructor also reviews differences in obstructive and restrictive respiratory diseases with examples of each.    Oxygen Safety Group instruction provided by PowerPoint, verbal discussion, and written material to support subject matter. There is an overview of What is Oxygen and Why do we need it.  Instructor also reviews how to create a safe environment for oxygen use, the importance of using oxygen as prescribed, and the risks of noncompliance. There is a brief discussion on traveling with oxygen and resources the patient may utilize. Flowsheet Row PULMONARY REHAB CHRONIC OBSTRUCTIVE PULMONARY DISEASE from 07/15/2024 in [facility]  Date 07/15/24  Educator RN  Instruction Review Code 1- Verbalizes Understanding    Oxygen Use Group instruction provided by PowerPoint, verbal discussion, and written material to discuss how supplemental oxygen is prescribed and different types of oxygen supply systems. Resources for more information are provided.    Breathing Techniques Group instruction that is supported by demonstration and informational handouts. Instructor discusses the benefits of pursed lip and diaphragmatic breathing and detailed demonstration on how to perform both.      Risk Factor Reduction Group instruction that is supported by a PowerPoint presentation. Instructor discusses the definition of a risk factor, different risk factors for pulmonary disease, and how the heart and lungs work together.   Pulmonary Diseases Group instruction provided by PowerPoint, verbal discussion, and written material to support subject matter. Instructor gives an overview of the different type of pulmonary diseases. There is also a discussion on risk factors and symptoms as well as ways to manage the diseases.   Stress and Energy Conservation Group instruction provided by PowerPoint, verbal discussion, and written material to support subject matter. Instructor gives an overview of stress and the impact it can have on the body. Instructor also reviews ways to reduce stress. There is also a discussion on energy conservation and ways to  conserve energy throughout the day.   Warning Signs and Symptoms Group instruction provided by PowerPoint, verbal discussion, and written material to support subject matter. Instructor reviews warning signs and symptoms of stroke, heart attack, cold and flu. Instructor also reviews ways to prevent the spread of infection.   Other Education Group or individual verbal, written, or video instructions that support the educational goals of the pulmonary rehab program.    Knowledge Questionnaire Score:  Knowledge Questionnaire Score - 06/18/24 1351       Knowledge Questionnaire Score   Pre Score 17/18          Core Components/Risk Factors/Patient Goals at Admission:  Personal Goals and Risk Factors at Admission - 06/18/24 1351       Core Components/Risk Factors/Patient Goals on Admission    Weight Management Weight Maintenance;Yes   Recently stopped smoking, doesn't want to gain weight, usually weight between 128-134#   Intervention Weight Management: Develop a combined nutrition and exercise program designed to reach desired caloric  intake, while maintaining appropriate intake of nutrient and fiber, sodium and fats, and appropriate energy expenditure required for the weight goal.;Weight Management: Provide education and appropriate resources to help participant work on and attain dietary goals.    Admit Weight 140 lb 3.4 oz (63.6 kg)    Expected Outcomes Short Term: Continue to assess and modify interventions until short term weight is achieved;Long Term: Adherence to nutrition and physical activity/exercise program aimed toward attainment of established weight goal;Weight Maintenance: Understanding of the daily nutrition guidelines, which includes 25-35% calories from fat, 7% or less cal from saturated fats, less than 200mg  cholesterol, less than 1.5gm of sodium, & 5 or more servings of fruits and vegetables daily;Understanding recommendations for meals to include 15-35% energy as protein, 25-35% energy from fat, 35-60% energy from carbohydrates, less than 200mg  of dietary cholesterol, 20-35 gm of total fiber daily;Understanding of distribution of calorie intake throughout the day with the consumption of 4-5 meals/snacks    Improve shortness of breath with ADL's Yes    Intervention Provide education, individualized exercise plan and daily activity instruction to help decrease symptoms of SOB with activities of daily living.    Expected Outcomes Short Term: Improve cardiorespiratory fitness to achieve a reduction of symptoms when performing ADLs;Long Term: Be able to perform more ADLs without symptoms or delay the onset of symptoms          Core Components/Risk Factors/Patient Goals Review:   Goals and Risk Factor Review     Row Name 06/21/24 1521 07/21/24 0909           Core Components/Risk Factors/Patient Goals Review   Personal Goals Review Weight Management/Obesity;Improve shortness of breath with ADL's;Develop more efficient breathing techniques such as purse lipped breathing and diaphragmatic breathing and practicing  self-pacing with activity. Weight Management/Obesity;Improve shortness of breath with ADL's;Develop more efficient breathing techniques such as purse lipped breathing and diaphragmatic breathing and practicing self-pacing with activity.      Review 30 day Core Components/Risk Factors/Patient Goals Review is as follows: Unable to assess goals yet. Dawson is scheduled to start the program next week. Monthly review of patient's Core Components/Risk Factors/Patient Goals:  Goal in progress for improving her shortness of breath with ADLs. Melanee is trying to build up her strength and endurance. She is exercising on the seated elliptical and walking the track. Her oxygen saturation has been WNL on room air. We are working with Nathanel to decrease her shortness of breath. Goal progressing for developing  more efficient breathing techniques such as purse lipped breathing and diaphragmatic breathing; and practicing self-pacing with activity. Westlyn needs to be prompted to perform purse lipped breathing while short of breath. We work on this with her while performing the warmup and while exercising. She is working on diaphragmatic breathing at home. Goal progressing for weight loss. Alahia is working with our dietitian to decrease her calorie intake. She is trying to eat breakfast on a more consistent basis. Arisha will continue to benefit from PR for nutrition, education, exercise, and lifestyle modification.      Expected Outcomes Pt will show progress toward meeting expected goals and outcomes. Pt will show progress toward meeting expected goals and outcomes.         Core Components/Risk Factors/Patient Goals at Discharge (Final Review):   Goals and Risk Factor Review - 07/21/24 0909       Core Components/Risk Factors/Patient Goals Review   Personal Goals Review Weight Management/Obesity;Improve shortness of breath with ADL's;Develop more efficient breathing techniques such as purse lipped breathing and diaphragmatic  breathing and practicing self-pacing with activity.    Review Monthly review of patient's Core Components/Risk Factors/Patient Goals:  Goal in progress for improving her shortness of breath with ADLs. Delaina is trying to build up her strength and endurance. She is exercising on the seated elliptical and walking the track. Her oxygen saturation has been WNL on room air. We are working with Nathanel to decrease her shortness of breath. Goal progressing for developing more efficient breathing techniques such as purse lipped breathing and diaphragmatic breathing; and practicing self-pacing with activity. Lubertha needs to be prompted to perform purse lipped breathing while short of breath. We work on this with her while performing the warmup and while exercising. She is working on diaphragmatic breathing at home. Goal progressing for weight loss. Rupal is working with our dietitian to decrease her calorie intake. She is trying to eat breakfast on a more consistent basis. Ajai will continue to benefit from PR for nutrition, education, exercise, and lifestyle modification.    Expected Outcomes Pt will show progress toward meeting expected goals and outcomes.          ITP Comments:   Comments: Pt is making expected progress toward Pulmonary Rehab goals after completing 6 session(s). Recommend continued exercise, life style modification, education, and utilization of breathing techniques to increase stamina and strength, while also decreasing shortness of breath with exertion.  Dr. Slater Staff is Medical Director for Pulmonary Rehab at [facility].       [1]  Current Outpatient Medications:    albuterol (VENTOLIN HFA) 108 (90 Base) MCG/ACT inhaler, Inhale 2 puffs into the lungs., Disp: , Rfl:    amLODipine (NORVASC) 10 MG tablet, Take 1 tablet by mouth daily., Disp: , Rfl:    bisoprolol (ZEBETA) 10 MG tablet, Take 10 mg by mouth. , Disp: , Rfl:    buPROPion (WELLBUTRIN XL) 300 MG 24 hr tablet, Take  300 mg by mouth., Disp: , Rfl:    Cholecalciferol (VITAMIN D ) 2000 UNITS tablet, Take 2,000 Units by mouth daily., Disp: , Rfl:    fluticasone-salmeterol (ADVAIR HFA) 115-21 MCG/ACT inhaler, Inhale 2 puffs into the lungs 2 (two) times daily., Disp: , Rfl:    losartan (COZAAR) 100 MG tablet, Take 100 mg by mouth daily., Disp: , Rfl:    montelukast (SINGULAIR) 10 MG tablet, Take 1 tablet by mouth at bedtime., Disp: , Rfl:    PARoxetine (PAXIL-CR) 25 MG 24 hr tablet,  Take 25 mg by mouth daily., Disp: , Rfl:    simvastatin (ZOCOR) 40 MG tablet, Take 40 mg by mouth every evening., Disp: , Rfl:    vitamin B-12 (CYANOCOBALAMIN) 1000 MCG tablet, Take 2,000 mcg by mouth daily., Disp: , Rfl:  [2]  Social History Tobacco Use  Smoking Status Former   Current packs/day: 0.00   Average packs/day: 0.5 packs/day for 44.3 years (22.2 ttl pk-yrs)   Types: Cigarettes   Start date: 01/30/1980   Quit date: 05/25/2024   Years since quitting: 0.1   Passive exposure: Current  Smokeless Tobacco Never  Tobacco Comments   Quit smoking 05/25/24

## 2024-07-29 ENCOUNTER — Encounter (HOSPITAL_COMMUNITY)
Admission: RE | Admit: 2024-07-29 | Discharge: 2024-07-29 | Disposition: A | Discharge status: Home / Self Care | Source: Ambulatory Visit | Attending: Internal Medicine | Admitting: Internal Medicine

## 2024-07-29 VITALS — Wt 141.3 lb

## 2024-07-29 DIAGNOSIS — J449 Chronic obstructive pulmonary disease, unspecified: Secondary | ICD-10-CM

## 2024-07-29 NOTE — Progress Notes (Signed)
 Daily Session Note  Patient Details  Name: Sherry Stone MRN: [id number] Date of Birth: [birth year] Referring Provider:   Conrad Ports Pulmonary Rehab Walk Test from 06/18/2024 in [facility], [facility], [facility]  Referring Provider Sharleen    Encounter Date: 07/29/2024  Check In:  Session Check In - 07/29/24 1338       Check-In   Supervising physician immediately available to respond to emergencies [facility] MD immediately available    Physician(s) Barnie Press, NP    Location [facility]    Staff Present Ronal Levin, RN, BSN;Randi Midge BS, ACSM-CEP, Exercise Physiologist;Gustabo Gordillo Claudene Neita Moats, MS, ACSM-CEP, Exercise Physiologist    Virtual Visit No    Medication changes reported     No    Fall or balance concerns reported    Yes    Tobacco Cessation No Change    Current number of cigarettes/nicotine per day     0    Warm-up and Cool-down Performed as group-led instruction    Resistance Training Performed Yes    VAD Patient? No    PAD/SET Patient? No      Pain Assessment   Currently in Pain? No/denies    Multiple Pain Sites No          Capillary Blood Glucose: No results found for this or any previous visit (from the past 24 hours).    Tobacco Use History[1]  Goals Met:  Proper associated with RPD/PD & O2 Sat Independence with exercise equipment Exercise tolerated well No report of concerns or symptoms today Strength training completed today  Goals Unmet:  Not Applicable  Comments: Service time is from 1306 to 1451.    Dr. Slater Staff is Medical Director for Pulmonary Rehab at [facility].     [1]  Social History Tobacco Use  Smoking Status Former   Current packs/day: 0.00   Average packs/day: 0.5 packs/day for 44.3 years (22.2 ttl pk-yrs)   Types: Cigarettes   Start date: 01/30/1980   Quit date: 05/25/2024   Years since quitting: 0.1   Passive exposure: Current   Smokeless Tobacco Never  Tobacco Comments   Quit smoking 05/25/24

## 2024-08-03 ENCOUNTER — Encounter (HOSPITAL_COMMUNITY)

## 2024-08-03 ENCOUNTER — Telehealth (HOSPITAL_COMMUNITY): Payer: Self-pay | Admitting: *Deleted

## 2024-08-03 NOTE — Telephone Encounter (Signed)
 Sherry Stone left message on department voicemail today. She spent 30 minutes trying to get out of her neighborhood, but unable to get up icy hill. She plans to return on Thursday if the ice has thawed. She can be reached at [phone] or [phone]. Will forward message to the PR staff and cancel today's appt.

## 2024-08-05 ENCOUNTER — Encounter (HOSPITAL_COMMUNITY)

## 2024-08-05 ENCOUNTER — Telehealth (HOSPITAL_COMMUNITY): Payer: Self-pay

## 2024-08-05 NOTE — Telephone Encounter (Signed)
 Called patient to advised of the elevator situation.  She cancelled due to her husband falling on ice.

## 2024-08-10 ENCOUNTER — Encounter (HOSPITAL_COMMUNITY)

## 2024-08-12 ENCOUNTER — Telehealth (HOSPITAL_COMMUNITY): Payer: Self-pay

## 2024-08-12 ENCOUNTER — Encounter (HOSPITAL_COMMUNITY)

## 2024-08-12 NOTE — Telephone Encounter (Signed)
 Patient c/o for 1:15 PR class, states she fell a few days ago and is still bruised.

## 2024-08-17 ENCOUNTER — Encounter (HOSPITAL_COMMUNITY)

## 2024-08-19 ENCOUNTER — Encounter (HOSPITAL_COMMUNITY)

## 2024-08-24 ENCOUNTER — Encounter (HOSPITAL_COMMUNITY)

## 2024-08-26 ENCOUNTER — Encounter (HOSPITAL_COMMUNITY)

## 2024-08-31 ENCOUNTER — Encounter (HOSPITAL_COMMUNITY)

## 2024-09-02 ENCOUNTER — Encounter (HOSPITAL_COMMUNITY)

## 2024-09-07 ENCOUNTER — Encounter (HOSPITAL_COMMUNITY)

## 2024-09-09 ENCOUNTER — Encounter (HOSPITAL_COMMUNITY)

## 2024-09-14 ENCOUNTER — Encounter (HOSPITAL_COMMUNITY)

## 2024-09-16 ENCOUNTER — Encounter (HOSPITAL_COMMUNITY)

## 2024-09-21 ENCOUNTER — Encounter (HOSPITAL_COMMUNITY)
